# Patient Record
Sex: Female | Born: 1985 | Race: White | Hispanic: No | Marital: Married | Smoking: Former smoker
Health system: Southern US, Community
[De-identification: ages and names within clinical notes are randomized; demographics above are authoritative.]

## PROBLEM LIST (undated history)

## (undated) DIAGNOSIS — F419 Anxiety disorder, unspecified: Secondary | ICD-10-CM

## (undated) DIAGNOSIS — K219 Gastro-esophageal reflux disease without esophagitis: Secondary | ICD-10-CM

## (undated) DIAGNOSIS — I429 Cardiomyopathy, unspecified: Secondary | ICD-10-CM

## (undated) DIAGNOSIS — S2220XA Unspecified fracture of sternum, initial encounter for closed fracture: Secondary | ICD-10-CM

## (undated) DIAGNOSIS — G43909 Migraine, unspecified, not intractable, without status migrainosus: Secondary | ICD-10-CM

## (undated) DIAGNOSIS — J45909 Unspecified asthma, uncomplicated: Secondary | ICD-10-CM

## (undated) DIAGNOSIS — F319 Bipolar disorder, unspecified: Secondary | ICD-10-CM

## (undated) DIAGNOSIS — S060X9A Concussion with loss of consciousness of unspecified duration, initial encounter: Secondary | ICD-10-CM

## (undated) DIAGNOSIS — I722 Aneurysm of renal artery: Secondary | ICD-10-CM

## (undated) DIAGNOSIS — S060XAA Concussion with loss of consciousness status unknown, initial encounter: Secondary | ICD-10-CM

## (undated) HISTORY — PX: NO PAST SURGERIES: SHX2092

## (undated) HISTORY — PX: ESOPHAGOGASTRODUODENOSCOPY ENDOSCOPY: SHX5814

---

## 2015-03-13 ENCOUNTER — Emergency Department
Admission: EM | Admit: 2015-03-13 | Discharge: 2015-03-13 | Disposition: A | Discharge status: Home / Self Care | Payer: Self-pay | Attending: Emergency Medicine | Admitting: Emergency Medicine

## 2015-03-13 ENCOUNTER — Encounter: Payer: Self-pay | Admitting: *Deleted

## 2015-03-13 ENCOUNTER — Emergency Department: Payer: Self-pay

## 2015-03-13 DIAGNOSIS — G44319 Acute post-traumatic headache, not intractable: Secondary | ICD-10-CM | POA: Insufficient documentation

## 2015-03-13 DIAGNOSIS — Z87891 Personal history of nicotine dependence: Secondary | ICD-10-CM | POA: Insufficient documentation

## 2015-03-13 DIAGNOSIS — Z041 Encounter for examination and observation following transport accident: Secondary | ICD-10-CM

## 2015-03-13 HISTORY — DX: Bipolar disorder, unspecified: F31.9

## 2015-03-13 HISTORY — DX: Anxiety disorder, unspecified: F41.9

## 2015-03-13 MED ORDER — METOCLOPRAMIDE HCL 10 MG PO TABS
10.0000 mg | ORAL_TABLET | Freq: Once | ORAL | Status: AC
  Administered 2015-03-13: 10 mg via ORAL
  Filled 2015-03-13: qty 1

## 2015-03-13 MED ORDER — HYDROCODONE-ACETAMINOPHEN 5-325 MG PO TABS: 1.0000 | ORAL_TABLET | Freq: Three times a day (TID) | ORAL | Status: DC | PRN

## 2015-03-13 MED ORDER — ORPHENADRINE CITRATE 30 MG/ML IJ SOLN
60.0000 mg | INTRAMUSCULAR | Status: AC
  Administered 2015-03-13: 60 mg via INTRAMUSCULAR
  Filled 2015-03-13: qty 2

## 2015-03-13 MED ORDER — KETOROLAC TROMETHAMINE 10 MG PO TABS: 10.0000 mg | ORAL_TABLET | Freq: Three times a day (TID) | ORAL | Status: DC

## 2015-03-13 MED ORDER — DIPHENHYDRAMINE HCL 25 MG PO CAPS
25.0000 mg | ORAL_CAPSULE | Freq: Once | ORAL | Status: AC
  Administered 2015-03-13: 25 mg via ORAL
  Filled 2015-03-13: qty 1

## 2015-03-13 MED ORDER — KETOROLAC TROMETHAMINE 60 MG/2ML IM SOLN
60.0000 mg | Freq: Once | INTRAMUSCULAR | Status: AC
  Administered 2015-03-13: 60 mg via INTRAMUSCULAR
  Filled 2015-03-13: qty 2

## 2015-03-13 MED ORDER — CYCLOBENZAPRINE HCL 5 MG PO TABS: 5.0000 mg | ORAL_TABLET | Freq: Three times a day (TID) | ORAL | Status: DC | PRN
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
## 2015-03-13 NOTE — Discharge Instructions (Signed)
General Headache Without Cause A headache is pain or discomfort felt around the head or neck area. The specific cause of a headache may not be found. There are many causes and types of headaches. A few common ones are:  Tension headaches.  Migraine headaches.  Cluster headaches.  Chronic daily headaches. HOME CARE INSTRUCTIONS   Keep all follow-up appointments with your caregiver or any specialist referral.  Only take over-the-counter or prescription medicines for pain or discomfort as directed by your caregiver.  Lie down in a dark, quiet room when you have a headache.  Keep a headache journal to find out what may trigger your migraine headaches. For example, write down:  What you eat and drink.  How much sleep you get.  Any change to your diet or medicines.  Try massage or other relaxation techniques.  Put ice packs or heat on the head and neck. Use these 3 to 4 times per day for 15 to 20 minutes each time, or as needed.  Limit stress.  Sit up straight, and do not tense your muscles.  Quit smoking if you smoke.  Limit alcohol use.  Decrease the amount of caffeine you drink, or stop drinking caffeine.  Eat and sleep on a regular schedule.  Get 7 to 9 hours of sleep, or as recommended by your caregiver.  Keep lights dim if bright lights bother you and make your headaches worse. SEEK MEDICAL CARE IF:   You have problems with the medicines you were prescribed.  Your medicines are not working.  You have a change from the usual headache.  You have nausea or vomiting. SEEK IMMEDIATE MEDICAL CARE IF:   Your headache becomes severe.  You have a fever.  You have a stiff neck.  You have loss of vision.  You have muscular weakness or loss of muscle control.  You start losing your balance or have trouble walking.  You feel faint or pass out.  You have severe symptoms that are different from your first symptoms. MAKE SURE YOU:   Understand these  instructions.  Will watch your condition.  Will get help right away if you are not doing well or get worse. Document Released: 06/29/2005 Document Revised: 09/21/2011 Document Reviewed: 07/15/2011 [facility] Patient Information 2015 [place], [place]. This information is not intended to replace advice given to you by your health care provider. Make sure you discuss any questions you have with your health care provider.   Your exam and head CT were normal today. Take the prescription meds as directed.  Apply ice to any sore muscles. Follow-up with your provider for ongoing symptoms.
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
## 2015-05-31 ENCOUNTER — Emergency Department
Admission: EM | Admit: 2015-05-31 | Discharge: 2015-05-31 | Disposition: A | Discharge status: Home / Self Care | Payer: No Typology Code available for payment source | Attending: Emergency Medicine | Admitting: Emergency Medicine

## 2015-05-31 ENCOUNTER — Encounter: Payer: Self-pay | Admitting: Emergency Medicine

## 2015-05-31 DIAGNOSIS — R51 Headache: Secondary | ICD-10-CM | POA: Diagnosis present

## 2015-05-31 DIAGNOSIS — Z87891 Personal history of nicotine dependence: Secondary | ICD-10-CM | POA: Diagnosis not present

## 2015-05-31 DIAGNOSIS — G43809 Other migraine, not intractable, without status migrainosus: Secondary | ICD-10-CM | POA: Diagnosis not present

## 2015-05-31 HISTORY — DX: Concussion with loss of consciousness of unspecified duration, initial encounter: S06.0X9A

## 2015-05-31 HISTORY — DX: Migraine, unspecified, not intractable, without status migrainosus: G43.909

## 2015-05-31 HISTORY — DX: Concussion with loss of consciousness status unknown, initial encounter: S06.0XAA

## 2015-05-31 HISTORY — DX: Unspecified fracture of sternum, initial encounter for closed fracture: S22.20XA

## 2015-05-31 MED ORDER — DEXAMETHASONE SODIUM PHOSPHATE 10 MG/ML IJ SOLN
INTRAMUSCULAR | Status: AC
  Administered 2015-05-31: 10 mg via INTRAVENOUS
  Filled 2015-05-31: qty 1

## 2015-05-31 MED ORDER — SODIUM CHLORIDE 0.9 % IV SOLN
Freq: Once | INTRAVENOUS | Status: AC
  Administered 2015-05-31: 20:00:00 via INTRAVENOUS

## 2015-05-31 MED ORDER — METOCLOPRAMIDE HCL 5 MG/ML IJ SOLN
10.0000 mg | Freq: Once | INTRAMUSCULAR | Status: AC
  Administered 2015-05-31: 10 mg via INTRAVENOUS
  Filled 2015-05-31 (×2): qty 2

## 2015-05-31 MED ORDER — DEXAMETHASONE SODIUM PHOSPHATE 10 MG/ML IJ SOLN
10.0000 mg | Freq: Once | INTRAMUSCULAR | Status: AC
  Administered 2015-05-31: 10 mg via INTRAVENOUS
  Filled 2015-05-31 (×2): qty 1

## 2015-05-31 MED ORDER — PROMETHAZINE HCL 25 MG PO TABS: 25.0000 mg | ORAL_TABLET | Freq: Four times a day (QID) | ORAL | Status: DC | PRN

## 2015-05-31 MED ORDER — KETOROLAC TROMETHAMINE 30 MG/ML IJ SOLN
30.0000 mg | Freq: Once | INTRAMUSCULAR | Status: AC
  Administered 2015-05-31: 30 mg via INTRAVENOUS
  Filled 2015-05-31: qty 1

## 2015-05-31 NOTE — ED Provider Notes (Signed)
[facility] Emergency Department Provider Note     Time seen: ----------------------------------------- 8:26 PM on 05/31/2015 -----------------------------------------    I have reviewed the triage vital signs and the nursing notes.   HISTORY  Chief Complaint Headache    HPI Kayla Garrison is a 29 y.o. female who presents to ER for headache. Patient states his history of migraines and she has expressed a headache with nausea last night. Patient states she can't hold anything down, states the headache is left-sided behind her eye. She took Imitrex and Topamax without any relief. She states photophobia and sensitivity to sound, states pain is different from her normal migraine.   Past Medical History  Diagnosis Date  . Bipolar 1 disorder (HCC)   . Anxiety   . Migraines   . Concussion   . Sternal fracture     There are no active problems to display for this patient.   No past surgical history on file.  Allergies Eggs or egg-derived products  Social History Social History  Substance Use Topics  . Smoking status: Former Games developer  . Smokeless tobacco: Never Used  . Alcohol Use: No    Review of Systems Constitutional: Negative for fever. Eyes: Negative for visual changes. ENT: Negative for sore throat. Cardiovascular: Negative for chest pain. Respiratory: Negative for shortness of breath. Gastrointestinal: Negative for abdominal pain, positive for nausea and vomiting Genitourinary: Negative for dysuria. Musculoskeletal: Negative for back pain. Skin: Negative for rash. Neurological: Positive for headache, focal weakness or numbness.  10-point ROS otherwise negative.  ____________________________________________   PHYSICAL EXAM:  VITAL SIGNS: ED Triage Vitals  Enc Vitals Group     BP 05/31/15 1923 122/68 mmHg     Pulse Rate 05/31/15 1923 90     Resp 05/31/15 1923 14     Temp 05/31/15 1923 97.9 F (36.6 C)     Temp Source  05/31/15 1923 Oral     SpO2 05/31/15 1923 98 %     Weight 05/31/15 1923 190 lb (86.183 kg)     Height 05/31/15 1923  (1.702 m)     Head Cir --      Peak Flow --      Pain Score 05/31/15 1924 9     Pain Loc --      Pain Edu? --      Excl. in GC? --     Constitutional: Alert and oriented. Well appearing and in no distress. Eyes: Conjunctivae are normal. PERRL. Normal extraocular movements. ENT   Head: Normocephalic and atraumatic.   Nose: No congestion/rhinnorhea.   Mouth/Throat: Mucous membranes are moist.   Neck: No stridor. Cardiovascular: Normal rate, regular rhythm. Normal and symmetric distal pulses are present in all extremities. No murmurs, rubs, or gallops. Respiratory: Normal respiratory effort without tachypnea nor retractions. Breath sounds are clear and equal bilaterally. No wheezes/rales/rhonchi. Gastrointestinal: Soft and nontender. No distention. No abdominal bruits.  Musculoskeletal: Nontender with normal range of motion in all extremities. No joint effusions.  No lower extremity tenderness nor edema. Neurologic:  Normal speech and language. No gross focal neurologic deficits are appreciated. Speech is normal. No gait instability. Skin:  Skin is warm, dry and intact. No rash noted. Psychiatric: Mood and affect are normal. Speech and behavior are normal. Patient exhibits appropriate insight and judgment. ___________________________________________  ED COURSE:  Pertinent labs & imaging results that were available during my care of the patient were reviewed by me and considered in my medical decision making (see chart for  details). Patient is in no acute distress, will give typical headache cocktail and reevaluate.  ____________________________________________  FINAL ASSESSMENT AND PLAN  Migraine  Plan: Patient with labs and imaging as dictated above. Patient looks well, feels better after IV fluids and headache medications. Patient remains  neurologically intact. She'll be discharged with close outpatient follow-up.   Emily FilbertWilliams, Beckham Buxbaum E, MD   Emily FilbertJonathan E Hollie Wojahn, MD 05/31/15 2033

## 2015-05-31 NOTE — ED Notes (Signed)
Pt states history of migraines. Pt states began to experience headache with nausea last pm. Pt states "i can't hold anything down." pt states headache is left sided "behind my eyeball". Pt states took imitrex and topamax without relief. Pt states photophobia and sensitivity to sound. perrl 3mm and brisk. No nuchal rigidity, pt denies fever. Pt states pain is "different" from previous migraines.

## 2015-05-31 NOTE — Discharge Instructions (Signed)

## 2015-06-11 ENCOUNTER — Other Ambulatory Visit: Payer: Self-pay

## 2015-06-11 ENCOUNTER — Emergency Department: Payer: No Typology Code available for payment source

## 2015-06-11 DIAGNOSIS — Z87891 Personal history of nicotine dependence: Secondary | ICD-10-CM | POA: Diagnosis not present

## 2015-06-11 DIAGNOSIS — R51 Headache: Secondary | ICD-10-CM | POA: Diagnosis present

## 2015-06-11 DIAGNOSIS — G43809 Other migraine, not intractable, without status migrainosus: Secondary | ICD-10-CM | POA: Diagnosis not present

## 2015-06-11 NOTE — ED Notes (Signed)
Pt in with co headache for few days, also co chest pain and left arm pain that started today.

## 2015-06-12 ENCOUNTER — Emergency Department
Admission: EM | Admit: 2015-06-12 | Discharge: 2015-06-12 | Disposition: A | Discharge status: Home / Self Care | Payer: No Typology Code available for payment source | Attending: Emergency Medicine | Admitting: Emergency Medicine

## 2015-06-12 DIAGNOSIS — G43809 Other migraine, not intractable, without status migrainosus: Secondary | ICD-10-CM

## 2015-06-12 LAB — BASIC METABOLIC PANEL
ANION GAP: 8 (ref 5–15)
BUN: 11 mg/dL (ref 6–20)
CALCIUM: 9 mg/dL (ref 8.9–10.3)
CO2: 24 mmol/L (ref 22–32)
Chloride: 109 mmol/L (ref 101–111)
Creatinine, Ser: 0.59 mg/dL (ref 0.44–1.00)
GLUCOSE: 144 mg/dL — AB (ref 65–99)
POTASSIUM: 3.4 mmol/L — AB (ref 3.5–5.1)
Sodium: 141 mmol/L (ref 135–145)

## 2015-06-12 LAB — CBC
HEMATOCRIT: 39 % (ref 35.0–47.0)
HEMOGLOBIN: 13.1 g/dL (ref 12.0–16.0)
MCH: 30.6 pg (ref 26.0–34.0)
MCHC: 33.7 g/dL (ref 32.0–36.0)
MCV: 90.8 fL (ref 80.0–100.0)
Platelets: 227 10*3/uL (ref 150–440)
RBC: 4.3 MIL/uL (ref 3.80–5.20)
RDW: 13.1 % (ref 11.5–14.5)
WBC: 9.5 10*3/uL (ref 3.6–11.0)

## 2015-06-12 LAB — TROPONIN I

## 2015-06-12 MED ORDER — ONDANSETRON HCL 4 MG/2ML IJ SOLN
4.0000 mg | Freq: Once | INTRAMUSCULAR | Status: AC
  Administered 2015-06-12: 4 mg via INTRAVENOUS
  Filled 2015-06-12: qty 2

## 2015-06-12 MED ORDER — KETOROLAC TROMETHAMINE 10 MG PO TABS
10.0000 mg | ORAL_TABLET | Freq: Once | ORAL | Status: AC
  Administered 2015-06-12: 10 mg via ORAL
  Filled 2015-06-12: qty 1

## 2015-06-12 MED ORDER — KETOROLAC TROMETHAMINE 30 MG/ML IJ SOLN
30.0000 mg | Freq: Once | INTRAMUSCULAR | Status: AC
  Administered 2015-06-12: 30 mg via INTRAVENOUS
  Filled 2015-06-12: qty 1

## 2015-06-12 MED ORDER — KETOROLAC TROMETHAMINE 10 MG PO TABS: 10.0000 mg | ORAL_TABLET | Freq: Three times a day (TID) | ORAL | Status: DC | PRN

## 2015-06-12 NOTE — Discharge Instructions (Signed)

## 2015-06-12 NOTE — ED Notes (Signed)
Pt ambulatory with steady gait upon D/C. Pt is waiting in lobby for ride, states ride will be here in 30 minutes.

## 2015-06-12 NOTE — ED Provider Notes (Signed)
[facility] Emergency Department Provider Note  ____________________________________________  Time seen: 2:30 AM  I have reviewed the triage vital signs and the nursing notes.   HISTORY  Chief Complaint Headache     HPI Kayla Garrison is a 29 y.o. female presents with complaint of left sided headache times "a few days". Patient states current pain score 7 out of 10. Patient states that the pain is located behind her left eye. Admits to history of migraines however states this migraine is "different". Of note patient was seen on 05/31/2015 with similar complaint CT scan was not performed at that time.     Past Medical History  Diagnosis Date  . Bipolar 1 disorder (HCC)   . Anxiety   . Migraines   . Concussion   . Sternal fracture     There are no active problems to display for this patient.   No past surgical history on file.  Current Outpatient Rx  Name  Route  Sig  Dispense  Refill  . cyclobenzaprine (FLEXERIL) 5 MG tablet   Oral   Take 1 tablet (5 mg total) by mouth every 8 (eight) hours as needed for muscle spasms.   12 tablet   0   . HYDROcodone-acetaminophen (NORCO) 5-325 MG per tablet   Oral   Take 1 tablet by mouth every 8 (eight) hours as needed for moderate pain.   10 tablet   0   . ketorolac (TORADOL) 10 MG tablet   Oral   Take 1 tablet (10 mg total) by mouth every 8 (eight) hours.   15 tablet   0   . promethazine (PHENERGAN) 25 MG tablet   Oral   Take 1 tablet (25 mg total) by mouth every 6 (six) hours as needed for nausea or vomiting.   20 tablet   0     Allergies Eggs or egg-derived products  No family history on file.  Social History Social History  Substance Use Topics  . Smoking status: Former Games developermoker  . Smokeless tobacco: Never Used  . Alcohol Use: No    Review of Systems  Constitutional: Negative for fever. Eyes: Negative for visual changes. ENT: Negative for sore throat. Cardiovascular:  Negative for chest pain. Respiratory: Negative for shortness of breath. Gastrointestinal: Negative for abdominal pain, vomiting and diarrhea. Genitourinary: Negative for dysuria. Musculoskeletal: Negative for back pain. Skin: Negative for rash. Neurological: Positive for headaches, negative for focal weakness or numbness.   10-point ROS otherwise negative.  ____________________________________________   PHYSICAL EXAM:  VITAL SIGNS: ED Triage Vitals  Enc Vitals Group     BP 06/11/15 2316 122/75 mmHg     Pulse Rate 06/11/15 2316 80     Resp 06/11/15 2316 18     Temp 06/11/15 2316 98.1 F (36.7 C)     Temp Source 06/11/15 2316 Oral     SpO2 06/11/15 2316 98 %     Weight 06/11/15 2316 170 lb (77.111 kg)     Height 06/11/15 2316 5\' 7"  (1.702 m)     Head Cir --      Peak Flow --      Pain Score 06/11/15 2318 6     Pain Loc --      Pain Edu? --      Excl. in GC? --      Constitutional: Alert and oriented. Well appearing and in no distress. Eyes: Conjunctivae are normal. PERRL. Normal extraocular movements. ENT   Head: Normocephalic and atraumatic.  Nose: No congestion/rhinnorhea.   Mouth/Throat: Mucous membranes are moist.   Neck: No stridor. Hematological/Lymphatic/Immunilogical: No cervical lymphadenopathy. Cardiovascular: Normal rate, regular rhythm. Normal and symmetric distal pulses are present in all extremities. No murmurs, rubs, or gallops. Respiratory: Normal respiratory effort without tachypnea nor retractions. Breath sounds are clear and equal bilaterally. No wheezes/rales/rhonchi. Gastrointestinal: Soft and nontender. No distention. There is no CVA tenderness. Genitourinary: deferred Musculoskeletal: Nontender with normal range of motion in all extremities. No joint effusions.  No lower extremity tenderness nor edema. Neurologic:  Normal speech and language. No gross focal neurologic deficits are appreciated. Speech is normal.  Skin:  Skin is warm,  dry and intact. No rash noted. Psychiatric: Mood and affect are normal. Speech and behavior are normal. Patient exhibits appropriate insight and judgment.  ____________________________________________    LABS (pertinent positives/negatives)  Labs Reviewed  BASIC METABOLIC PANEL - Abnormal; Notable for the following:    Potassium 3.4 (*)    Glucose, Bld 144 (*)    All other components within normal limits  CBC  TROPONIN I      RADIOLOGY       CT Head Wo Contrast (Final result) Result time: 06/12/15 00:04:51   Final result by Rad Results In Interface (06/12/15 00:04:51)   Narrative:   CLINICAL DATA: Headache for several days.  EXAM: CT HEAD WITHOUT CONTRAST  TECHNIQUE: Contiguous axial images were obtained from the base of the skull through the vertex without intravenous contrast.  COMPARISON: 03/13/2015  FINDINGS: There is no intracranial hemorrhage, mass or evidence of acute infarction. There is no extra-axial fluid collection. Gray matter and white matter appear normal. Cerebral volume is normal for age. Brainstem and posterior fossa are unremarkable. The CSF spaces appear normal.  The bony structures are intact. The visible portions of the paranasal sinuses are clear.  IMPRESSION: Normal brain   Electronically Signed By: Ellery Plunk M.D. On: 06/12/2015 00:04            INITIAL IMPRESSION / ASSESSMENT AND PLAN / ED COURSE  Pertinent labs & imaging results that were available during my care of the patient were reviewed by me and considered in my medical decision making (see chart for details).  Toradol 30 mg IV and Zofran 4 mg IV given with resolution of pain  ____________________________________________   FINAL CLINICAL IMPRESSION(S) / ED DIAGNOSES  Final diagnoses:  Other migraine without status migrainosus, not intractable      Darci Current, MD 06/12/15 [phone]

## 2015-06-12 NOTE — ED Notes (Signed)
Pt presents to ED with c/o headache x 2 weeks, pt reports was seen here 2 weeks for same symptoms. Pt c/o left sided headache, left sided weakness, and left sided chest pain. When asked to point where pt is hurting pt points to left side of ribs. (+) photosensitivity and sensitive to sound. Pt denies abdominal pain, shortness of breath, or fevers. Pt alert and oriented x 4, no increased work in breathing noted, skin warm and dry. Call bell within reach.

## 2015-08-13 ENCOUNTER — Encounter: Payer: Self-pay | Admitting: Emergency Medicine

## 2015-08-13 ENCOUNTER — Emergency Department: Payer: BLUE CROSS/BLUE SHIELD

## 2015-08-13 ENCOUNTER — Emergency Department
Admission: EM | Admit: 2015-08-13 | Discharge: 2015-08-13 | Disposition: A | Discharge status: Home / Self Care | Payer: BLUE CROSS/BLUE SHIELD | Attending: Student | Admitting: Student

## 2015-08-13 DIAGNOSIS — F419 Anxiety disorder, unspecified: Secondary | ICD-10-CM | POA: Insufficient documentation

## 2015-08-13 DIAGNOSIS — Z87891 Personal history of nicotine dependence: Secondary | ICD-10-CM | POA: Insufficient documentation

## 2015-08-13 DIAGNOSIS — R238 Other skin changes: Secondary | ICD-10-CM

## 2015-08-13 DIAGNOSIS — R208 Other disturbances of skin sensation: Secondary | ICD-10-CM

## 2015-08-13 DIAGNOSIS — Z791 Long term (current) use of non-steroidal anti-inflammatories (NSAID): Secondary | ICD-10-CM | POA: Insufficient documentation

## 2015-08-13 DIAGNOSIS — L539 Erythematous condition, unspecified: Secondary | ICD-10-CM

## 2015-08-13 DIAGNOSIS — M25562 Pain in left knee: Secondary | ICD-10-CM | POA: Diagnosis present

## 2015-08-13 DIAGNOSIS — F319 Bipolar disorder, unspecified: Secondary | ICD-10-CM | POA: Insufficient documentation

## 2015-08-13 MED ORDER — TRAMADOL HCL 50 MG PO TABS
50.0000 mg | ORAL_TABLET | Freq: Once | ORAL | Status: AC
  Administered 2015-08-13: 50 mg via ORAL
  Filled 2015-08-13: qty 1

## 2015-08-13 MED ORDER — NAPROXEN 500 MG PO TABS: 500.0000 mg | ORAL_TABLET | Freq: Two times a day (BID) | ORAL | Status: DC

## 2015-08-13 MED ORDER — NAPROXEN 500 MG PO TABS
500.0000 mg | ORAL_TABLET | Freq: Once | ORAL | Status: AC
  Administered 2015-08-13: 500 mg via ORAL
  Filled 2015-08-13: qty 1

## 2015-08-13 MED ORDER — TRAMADOL HCL 50 MG PO TABS: 50.0000 mg | ORAL_TABLET | Freq: Four times a day (QID) | ORAL | Status: DC | PRN
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
## 2015-08-13 NOTE — ED Provider Notes (Signed)
[facility] Emergency Department Provider Note  ____________________________________________  Time seen: Approximately 1:00 PM  I have reviewed the triage vital signs and the nursing notes.   HISTORY  Chief Complaint Leg Pain and Leg Swelling    HPI Kayla Garrison is a 30 y.o. female patient complaining of pain and swelling popliteal area of the left leg. She also states since The pain. Patient says no provocative incident. Patient stated pain increases with ambulation and weightbearing.Patient rating the pain as 8/10. No palliative this complaint.   Past Medical History  Diagnosis Date  . Bipolar 1 disorder (HCC)   . Anxiety   . Migraines   . Concussion   . Sternal fracture     There are no active problems to display for this patient.   No past surgical history on file.  Current Outpatient Rx  Name  Route  Sig  Dispense  Refill  . cyclobenzaprine (FLEXERIL) 5 MG tablet   Oral   Take 1 tablet (5 mg total) by mouth every 8 (eight) hours as needed for muscle spasms.   12 tablet   0   . HYDROcodone-acetaminophen (NORCO) 5-325 MG per tablet   Oral   Take 1 tablet by mouth every 8 (eight) hours as needed for moderate pain.   10 tablet   0   . ketorolac (TORADOL) 10 MG tablet   Oral   Take 1 tablet (10 mg total) by mouth every 8 (eight) hours.   15 tablet   0   . ketorolac (TORADOL) 10 MG tablet   Oral   Take 1 tablet (10 mg total) by mouth every 8 (eight) hours as needed.   20 tablet   0   . naproxen (NAPROSYN) 500 MG tablet   Oral   Take 1 tablet (500 mg total) by mouth 2 (two) times daily with a meal.   20 tablet   0   . promethazine (PHENERGAN) 25 MG tablet   Oral   Take 1 tablet (25 mg total) by mouth every 6 (six) hours as needed for nausea or vomiting.   20 tablet   0   . traMADol (ULTRAM) 50 MG tablet   Oral   Take 1 tablet (50 mg total) by mouth every 6 (six) hours as needed for moderate pain.   12 tablet   0      Allergies Eggs or egg-derived products  No family history on file.  Social History Social History  Substance Use Topics  . Smoking status: Former Games developer  . Smokeless tobacco: Never Used  . Alcohol Use: No    Review of Systems Constitutional: No fever/chills Eyes: No visual changes. ENT: No sore throat. Cardiovascular: Denies chest pain. Respiratory: Denies shortness of breath. Gastrointestinal: No abdominal pain.  No nausea, no vomiting.  No diarrhea.  No constipation. Genitourinary: Negative for dysuria. Musculoskeletal: Left leg pain popliteal area and left calf.. Skin: Negative for rash. Neurological: Negative for headaches, focal weakness or numbness. Psychiatric:Bipolar and anxiety. 10-point ROS otherwise negative.  ____________________________________________   PHYSICAL EXAM:  VITAL SIGNS: ED Triage Vitals  Enc Vitals Group     BP 08/13/15 1009 109/69 mmHg     Pulse Rate 08/13/15 1009 78     Resp 08/13/15 1009 14     Temp 08/13/15 1009 97.8 F (36.6 C)     Temp Source 08/13/15 1009 Oral     SpO2 08/13/15 1009 100 %     Weight 08/13/15 1009 170 lb (77.111  kg)     Height 08/13/15 1009  (1.702 m)     Head Cir --      Peak Flow --      Pain Score --      Pain Loc --      Pain Edu? --      Excl. in GC? --     Constitutional: Alert and oriented. Well appearing and in no acute distress. Eyes: Conjunctivae are normal. PERRL. EOMI. Head: Atraumatic. Nose: No congestion/rhinnorhea. Mouth/Throat: Mucous membranes are moist.  Oropharynx non-erythematous. Neck: No stridor.  No cervical spine tenderness to palpation. Hematological/Lymphatic/Immunilogical: No cervical lymphadenopathy. Cardiovascular: Normal rate, regular rhythm. Grossly normal heart sounds.  Good peripheral circulation. Respiratory: Normal respiratory effort.  No retractions. Lungs CTAB. Gastrointestinal: Soft and nontender. No distention. No abdominal bruits. No CVA  tenderness. Musculoskeletal: No obvious deformity or edema erythema to the left lower extremity. Patient has some moderate guarding palpation as a 7 popliteal area. Patient also has some posterior calf guarding with palpation. Negative Homans sign.  Neurologic:  Normal speech and language. No gross focal neurologic deficits are appreciated. No gait instability. Skin:  Skin is warm, dry and intact. No rash noted. Psychiatric: Mood and affect are normal. Speech and behavior are normal.  ____________________________________________   LABS (all labs ordered are listed, but only abnormal results are displayed)  Labs Reviewed - No data to display ____________________________________________  EKG   ____________________________________________  RADIOLOGY  Ultrasound was negative for DVT. X-ray of the knee negative for acute findings. ____________________________________________   PROCEDURES  Procedure(s) performed: None  Critical Care performed: No  ____________________________________________   INITIAL IMPRESSION / ASSESSMENT AND PLAN / ED COURSE  Pertinent labs & imaging results that were available during my care of the patient were reviewed by me and considered in my medical decision making (see chart for details).  Left knee pain. Reexamination of the knee again shows no obvious edema erythema. Discussed x-ray and ultrasound cells with patient. Patient given a prescription for tramadol and naproxen. She advised return by ER for condition worsens.  FINAL CLINICAL IMPRESSION(S) / ED DIAGNOSES  Final diagnoses:  Posterior left knee pain      Joni Reining, PA-C 08/13/15 1407  Joni Reining, PA-C 08/13/15 1420  Gayla Doss, MD 08/13/15 1759

## 2015-08-23 ENCOUNTER — Other Ambulatory Visit: Payer: Self-pay | Admitting: Unknown Physician Specialty

## 2015-08-23 DIAGNOSIS — M25552 Pain in left hip: Secondary | ICD-10-CM

## 2015-08-24 ENCOUNTER — Emergency Department
Admission: EM | Admit: 2015-08-24 | Discharge: 2015-08-25 | Disposition: A | Discharge status: Home / Self Care | Payer: BLUE CROSS/BLUE SHIELD | Attending: Emergency Medicine | Admitting: Emergency Medicine

## 2015-08-24 ENCOUNTER — Emergency Department: Payer: BLUE CROSS/BLUE SHIELD

## 2015-08-24 ENCOUNTER — Encounter: Payer: Self-pay | Admitting: Emergency Medicine

## 2015-08-24 DIAGNOSIS — Z3202 Encounter for pregnancy test, result negative: Secondary | ICD-10-CM | POA: Diagnosis not present

## 2015-08-24 DIAGNOSIS — R109 Unspecified abdominal pain: Secondary | ICD-10-CM

## 2015-08-24 DIAGNOSIS — Z87891 Personal history of nicotine dependence: Secondary | ICD-10-CM | POA: Diagnosis not present

## 2015-08-24 DIAGNOSIS — Z791 Long term (current) use of non-steroidal anti-inflammatories (NSAID): Secondary | ICD-10-CM | POA: Insufficient documentation

## 2015-08-24 DIAGNOSIS — N760 Acute vaginitis: Secondary | ICD-10-CM | POA: Diagnosis not present

## 2015-08-24 DIAGNOSIS — R1030 Lower abdominal pain, unspecified: Secondary | ICD-10-CM | POA: Diagnosis present

## 2015-08-24 DIAGNOSIS — B9689 Other specified bacterial agents as the cause of diseases classified elsewhere: Secondary | ICD-10-CM

## 2015-08-24 HISTORY — DX: Unspecified asthma, uncomplicated: J45.909

## 2015-08-24 LAB — COMPREHENSIVE METABOLIC PANEL
ALK PHOS: 61 U/L (ref 38–126)
ALT: 9 U/L — AB (ref 14–54)
AST: 12 U/L — ABNORMAL LOW (ref 15–41)
Albumin: 4.3 g/dL (ref 3.5–5.0)
Anion gap: 8 (ref 5–15)
BILIRUBIN TOTAL: 1 mg/dL (ref 0.3–1.2)
BUN: 10 mg/dL (ref 6–20)
CALCIUM: 8.7 mg/dL — AB (ref 8.9–10.3)
CO2: 23 mmol/L (ref 22–32)
CREATININE: 0.67 mg/dL (ref 0.44–1.00)
Chloride: 106 mmol/L (ref 101–111)
Glucose, Bld: 101 mg/dL — ABNORMAL HIGH (ref 65–99)
Potassium: 3.7 mmol/L (ref 3.5–5.1)
Sodium: 137 mmol/L (ref 135–145)
Total Protein: 7 g/dL (ref 6.5–8.1)

## 2015-08-24 LAB — URINALYSIS COMPLETE WITH MICROSCOPIC (ARMC ONLY)
BACTERIA UA: NONE SEEN
BILIRUBIN URINE: NEGATIVE
GLUCOSE, UA: NEGATIVE mg/dL
NITRITE: NEGATIVE
Protein, ur: NEGATIVE mg/dL
RBC / HPF: NONE SEEN RBC/hpf (ref 0–5)
SPECIFIC GRAVITY, URINE: 1.024 (ref 1.005–1.030)
Squamous Epithelial / LPF: NONE SEEN
WBC UA: NONE SEEN WBC/hpf (ref 0–5)
pH: 7 (ref 5.0–8.0)

## 2015-08-24 LAB — WET PREP, GENITAL
SPERM: NONE SEEN
Trich, Wet Prep: NONE SEEN
Yeast Wet Prep HPF POC: NONE SEEN

## 2015-08-24 LAB — POCT PREGNANCY, URINE: Preg Test, Ur: NEGATIVE

## 2015-08-24 LAB — CBC
HEMATOCRIT: 40.6 % (ref 35.0–47.0)
HEMOGLOBIN: 13.6 g/dL (ref 12.0–16.0)
MCH: 30.6 pg (ref 26.0–34.0)
MCHC: 33.4 g/dL (ref 32.0–36.0)
MCV: 91.4 fL (ref 80.0–100.0)
Platelets: 222 10*3/uL (ref 150–440)
RBC: 4.44 MIL/uL (ref 3.80–5.20)
RDW: 13.1 % (ref 11.5–14.5)
WBC: 12.1 10*3/uL — ABNORMAL HIGH (ref 3.6–11.0)

## 2015-08-24 LAB — LIPASE, BLOOD: Lipase: 29 U/L (ref 11–51)

## 2015-08-24 MED ORDER — METRONIDAZOLE 500 MG PO TABS: 500.0000 mg | ORAL_TABLET | Freq: Two times a day (BID) | ORAL | Status: DC

## 2015-08-24 MED ORDER — MORPHINE SULFATE (PF) 4 MG/ML IV SOLN
INTRAVENOUS | Status: AC
  Filled 2015-08-24: qty 1

## 2015-08-24 MED ORDER — SODIUM CHLORIDE 0.9 % IV BOLUS (SEPSIS)
1000.0000 mL | Freq: Once | INTRAVENOUS | Status: AC
  Administered 2015-08-24: 1000 mL via INTRAVENOUS

## 2015-08-24 MED ORDER — IOHEXOL 240 MG/ML SOLN
25.0000 mL | Freq: Once | INTRAMUSCULAR | Status: AC | PRN
  Administered 2015-08-24: 25 mL via ORAL

## 2015-08-24 MED ORDER — IOHEXOL 300 MG/ML  SOLN
100.0000 mL | Freq: Once | INTRAMUSCULAR | Status: AC | PRN
  Administered 2015-08-24: 100 mL via INTRAVENOUS

## 2015-08-24 MED ORDER — ONDANSETRON HCL 4 MG/2ML IJ SOLN
4.0000 mg | Freq: Once | INTRAMUSCULAR | Status: AC
  Administered 2015-08-24: 4 mg via INTRAVENOUS
  Filled 2015-08-24: qty 2

## 2015-08-24 MED ORDER — MORPHINE SULFATE (PF) 4 MG/ML IV SOLN
4.0000 mg | Freq: Once | INTRAVENOUS | Status: AC
  Administered 2015-08-24: 4 mg via INTRAVENOUS

## 2015-08-24 MED ORDER — MORPHINE SULFATE (PF) 4 MG/ML IV SOLN
4.0000 mg | Freq: Once | INTRAVENOUS | Status: AC
  Administered 2015-08-24: 4 mg via INTRAVENOUS
  Filled 2015-08-24: qty 1

## 2015-08-24 NOTE — ED Notes (Signed)
Pt sleeping. 
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
## 2015-08-24 NOTE — ED Notes (Signed)
Dr. Shaune Pollack in with Kayla Garrison, ed tech to perform pelvic exam. Pt states nausea gone, pain improved.

## 2015-08-24 NOTE — ED Notes (Signed)
Lower L abdomen pain x several months, began vomiting today.

## 2015-08-25 LAB — CHLAMYDIA/NGC RT PCR (ARMC ONLY)
CHLAMYDIA TR: NOT DETECTED
N GONORRHOEAE: NOT DETECTED

## 2015-08-25 MED ORDER — DOXYCYCLINE HYCLATE 50 MG PO CAPS: 100.0000 mg | ORAL_CAPSULE | Freq: Two times a day (BID) | ORAL | Status: DC

## 2015-08-25 MED ORDER — DEXTROSE 5 % IV SOLN
250.0000 mg | Freq: Once | INTRAVENOUS | Status: AC
  Administered 2015-08-25: 250 mg via INTRAVENOUS
  Filled 2015-08-25: qty 250

## 2015-08-25 MED ORDER — DOXYCYCLINE HYCLATE 100 MG PO TABS
100.0000 mg | ORAL_TABLET | Freq: Once | ORAL | Status: AC
  Administered 2015-08-25: 100 mg via ORAL
  Filled 2015-08-25 (×2): qty 1

## 2015-08-25 NOTE — ED Provider Notes (Addendum)
-----------------------------------------   1:19 AM on 08/25/2015 -----------------------------------------  Care Was assumed from Dr. Shaune Pollack  At 12 am pending results of CT abdomen and pelvis. CT scan shows nonobstructing 4 mm left nephrolithiasis but no other acute intra-abdominal or pelvic process. The patient appears well. DC with return precautions and close PCP and GYN follow-up. She is comfortable with the discharge plan. Dr. Shaune Pollack provided prescription for doxycycline for possible pelvic inflammatory disease and flagyl for BV.  Gayla Doss, MD 08/25/15 1610  Gayla Doss, MD 08/25/15 [phone]

## 2015-08-25 NOTE — ED Notes (Signed)
Pt returned from ct scan, reports improved nausea and pain. Denies needs at this time.

## 2015-08-25 NOTE — Discharge Instructions (Signed)
Return to the emergency department for any worsening abdominal pain, vomiting, black or bloody stools, fever, or any other symptoms concerning to you.   Bacterial Vaginosis Bacterial vaginosis is an infection of the vagina. It happens when too many germs (bacteria) grow in the vagina. Having this infection puts you at risk for getting other infections from sex. Treating this infection can help lower your risk for other infections, such as:   Chlamydia.  Gonorrhea.  HIV.  Herpes. HOME CARE  Take your medicine as told by your doctor.  Finish your medicine even if you start to feel better.  Tell your sex partner that you have an infection. They should see their doctor for treatment.  During treatment:  Avoid sex or use condoms correctly.  Do not douche.  Do not drink alcohol unless your doctor tells you it is ok.  Do not breastfeed unless your doctor tells you it is ok. GET HELP IF:  You are not getting better after 3 days of treatment.  You have more grey fluid (discharge) coming from your vagina than before.  You have more pain than before.  You have a fever. MAKE SURE YOU:   Understand these instructions.  Will watch your condition.  Will get help right away if you are not doing well or get worse.   This information is not intended to replace advice given to you by your health care provider. Make sure you discuss any questions you have with your health care provider.   Document Released: 04/07/2008 Document Revised: 07/20/2014 Document Reviewed: 02/08/2013 Elsevier Interactive Patient Education 2016 Else[employer] Inc[employer]  Abdominal Pain, Adult Many things can cause abdominal pain. Usually, abdominal pain is not caused by a disease and will improve without treatment. It can often be observed and treated at home. Your health care provider will do a physical exam and possibly order blood tests and X-rays to help determine the seriousness of your pain. However, in many  cases, more time must pass before a clear cause of the pain can be found. Before that point, your health care provider may not know if you need more testing or further treatment. HOME CARE INSTRUCTIONS Monitor your abdominal pain for any changes. The following actions may help to alleviate any discomfort you are experiencing:  Only take over-the-counter or prescription medicines as directed by your health care provider.  Do not take laxatives unless directed to do so by your health care provider.  Try a clear liquid diet (broth, tea, or water) as directed by your health care provider. Slowly move to a bland diet as tolerated. SEEK MEDICAL CARE IF:  You have unexplained abdominal pain.  You have abdominal pain associated with nausea or diarrhea.  You have pain when you urinate or have a bowel movement.  You experience abdominal pain that wakes you in the night.  You have abdominal pain that is worsened or improved by eating food.  You have abdominal pain that is worsened with eating fatty foods.  You have a fever. SEEK IMMEDIATE MEDICAL CARE IF:  Your pain does not go away within 2 hours.  You keep throwing up (vomiting).  Your pain is felt only in portions of the abdomen, such as the right side or the left lower portion of the abdomen.  You pass bloody or black tarry stools. MAKE SURE YOU:  Understand these instructions.  Will watch your condition.  Will get help right away if you are not doing well or get worse.  This information is not intended to replace advice given to you by your health care provider. Make sure you discuss any questions you have with your health care provider.   Document Released: 04/08/2005 Document Revised: 03/20/2015 Document Reviewed: 03/08/2013 Elsevier Interactive Patient Education 2016 Elsevier Inc[employer]Pelvic Inflammatory Disease Pelvic inflammatory disease (PID) refers to an infection in some or all of the female organs. The infection can be in  the uterus, ovaries, fallopian tubes, or the surrounding tissues in the pelvis. PID can cause abdominal or pelvic pain that comes on suddenly (acute pelvic pain). PID is a serious infection because it can lead to lasting (chronic) pelvic pain or the inability to have children (infertility). CAUSES This condition is most often caused by an infection that is spread during sexual contact. However, the infection can also be caused by the normal bacteria that are found in the vaginal tissues if these bacteria travel upward into the reproductive organs. PID can also occur following:  The birth of a baby.  A miscarriage.  An abortion.  Major pelvic surgery.  The use of an intrauterine device (IUD).  A sexual assault. RISK FACTORS This condition is more likely to develop in women who:  Are younger than 32 years of age.  Are sexually active at [facility] age.  Use nonbarrier contraception.  Have multiple sexual partners.  Have sex with someone who has symptoms of an STD (sexually transmitted disease).  Use oral contraception. At times, certain behaviors can also increase the possibility of getting PID, such as:  Using a vaginal douche.  Having an IUD in place. SYMPTOMS Symptoms of this condition include:  Abdominal or pelvic pain.  Fever.  Chills.  Abnormal vaginal discharge.  Abnormal uterine bleeding.  Unusual pain shortly after the end of a menstrual period.  Painful urination.  Pain with sexual intercourse.  Nausea and vomiting. DIAGNOSIS To diagnose this condition, your health care provider will do a physical exam and take your medical history. A pelvic exam typically reveals great tenderness in the uterus and the surrounding pelvic tissues. You may also have tests, such as:  Lab tests, including a pregnancy test, blood tests, and urine test.  Culture tests of the vagina and cervix to check for an STD.  Ultrasound.  A laparoscopic procedure to look inside the  pelvis.  Examining vaginal secretions under a microscope. TREATMENT Treatment for this condition may involve one or more approaches.  Antibiotic medicines may be prescribed to be taken by mouth.  Sexual partners may need to be treated if the infection is caused by an STD.  For more severe cases, hospitalization may be needed to give antibiotics directly into a vein through an IV tube.  Surgery may be needed if other treatments do not help, but this is rare. It may take weeks until you are completely well. If you are diagnosed with PID, you should also be checked for human immunodeficiency virus (HIV). Your health care provider may test you for infection again 3 months after treatment. You should not have unprotected sex. HOME CARE INSTRUCTIONS  Take over-the-counter and prescription medicines only as told by your health care provider.  If you were prescribed an antibiotic medicine, take it as told by your health care provider. Do not stop taking the antibiotic even if you start to feel better.  Do not have sexual intercourse until treatment is completed or as told by your health care provider. If PID is confirmed, your recent sexual partners will need treatment, especially if  you had unprotected sex.  Keep all follow-up visits as told by your health care provider. This is important. SEEK MEDICAL CARE IF:  You have increased or abnormal vaginal discharge.  Your pain does not improve.  You vomit.  You have a fever.  You cannot tolerate your medicines.  Your partner has an STD.  You have pain when you urinate. SEEK IMMEDIATE MEDICAL CARE IF:  You have increased abdominal or pelvic pain.  You have chills.  Your symptoms are not better in 72 hours even with treatment.   This information is not intended to replace advice given to you by your health care provider. Make sure you discuss any questions you have with your health care provider.   Document Released: 06/29/2005  Document Revised: 03/20/2015 Document Reviewed: 08/06/2014 Elsevier Interactive Patient Education [employer]

## 2015-08-25 NOTE — ED Notes (Signed)
Informed pt once IV antibiotic is infused she will be going home, pt reports she was informed after infusion she needed to wait 30 minutes, advised pt that statement is correct pt reports she told her husband to come and pick her up at 02:00am. Informed pt it will be around 02:30 after infusion.

## 2015-08-26 ENCOUNTER — Other Ambulatory Visit: Payer: Self-pay | Admitting: Unknown Physician Specialty

## 2015-08-26 DIAGNOSIS — M25552 Pain in left hip: Secondary | ICD-10-CM

## 2015-08-26 DIAGNOSIS — R1032 Left lower quadrant pain: Secondary | ICD-10-CM

## 2015-08-28 ENCOUNTER — Ambulatory Visit
Admission: RE | Admit: 2015-08-28 | Discharge: 2015-08-28 | Disposition: A | Discharge status: Home / Self Care | Payer: BLUE CROSS/BLUE SHIELD | Source: Ambulatory Visit | Attending: Unknown Physician Specialty | Admitting: Unknown Physician Specialty

## 2015-09-02 ENCOUNTER — Emergency Department
Admission: EM | Admit: 2015-09-02 | Discharge: 2015-09-02 | Disposition: A | Discharge status: Home / Self Care | Payer: BLUE CROSS/BLUE SHIELD | Attending: Emergency Medicine | Admitting: Emergency Medicine

## 2015-09-02 ENCOUNTER — Encounter: Payer: Self-pay | Admitting: Emergency Medicine

## 2015-09-02 ENCOUNTER — Emergency Department: Payer: BLUE CROSS/BLUE SHIELD

## 2015-09-02 DIAGNOSIS — Z79899 Other long term (current) drug therapy: Secondary | ICD-10-CM | POA: Insufficient documentation

## 2015-09-02 DIAGNOSIS — Z87891 Personal history of nicotine dependence: Secondary | ICD-10-CM | POA: Insufficient documentation

## 2015-09-02 DIAGNOSIS — R102 Pelvic and perineal pain: Secondary | ICD-10-CM | POA: Diagnosis not present

## 2015-09-02 DIAGNOSIS — R1032 Left lower quadrant pain: Secondary | ICD-10-CM

## 2015-09-02 DIAGNOSIS — Z791 Long term (current) use of non-steroidal anti-inflammatories (NSAID): Secondary | ICD-10-CM | POA: Insufficient documentation

## 2015-09-02 DIAGNOSIS — Z3202 Encounter for pregnancy test, result negative: Secondary | ICD-10-CM | POA: Insufficient documentation

## 2015-09-02 DIAGNOSIS — Z792 Long term (current) use of antibiotics: Secondary | ICD-10-CM | POA: Insufficient documentation

## 2015-09-02 LAB — POCT PREGNANCY, URINE: PREG TEST UR: NEGATIVE

## 2015-09-02 LAB — COMPREHENSIVE METABOLIC PANEL
ALBUMIN: 4.3 g/dL (ref 3.5–5.0)
ALT: 10 U/L — ABNORMAL LOW (ref 14–54)
ANION GAP: 6 (ref 5–15)
AST: 13 U/L — AB (ref 15–41)
Alkaline Phosphatase: 49 U/L (ref 38–126)
BUN: 13 mg/dL (ref 6–20)
CHLORIDE: 105 mmol/L (ref 101–111)
CO2: 30 mmol/L (ref 22–32)
Calcium: 9.2 mg/dL (ref 8.9–10.3)
Creatinine, Ser: 0.65 mg/dL (ref 0.44–1.00)
GFR calc Af Amer: >60 mL/min (ref >60)
GFR calc non Af Amer: >60 mL/min (ref >60)
GLUCOSE: 90 mg/dL (ref 65–99)
POTASSIUM: 3.7 mmol/L (ref 3.5–5.1)
SODIUM: 141 mmol/L (ref 135–145)
Total Bilirubin: 0.6 mg/dL (ref 0.3–1.2)
Total Protein: 6.8 g/dL (ref 6.5–8.1)

## 2015-09-02 LAB — CBC WITH DIFFERENTIAL/PLATELET
BASOS ABS: 0 10*3/uL (ref 0–0.1)
BASOS PCT: 1 %
Eosinophils Absolute: 0.5 10*3/uL (ref 0–0.7)
Eosinophils Relative: 5 %
HEMATOCRIT: 39.7 % (ref 35.0–47.0)
HEMOGLOBIN: 13.5 g/dL (ref 12.0–16.0)
LYMPHS PCT: 28 %
Lymphs Abs: 2.6 10*3/uL (ref 1.0–3.6)
MCH: 31 pg (ref 26.0–34.0)
MCHC: 33.9 g/dL (ref 32.0–36.0)
MCV: 91.6 fL (ref 80.0–100.0)
MONO ABS: 0.6 10*3/uL (ref 0.2–0.9)
MONOS PCT: 7 %
NEUTROS ABS: 5.4 10*3/uL (ref 1.4–6.5)
NEUTROS PCT: 59 %
Platelets: 219 10*3/uL (ref 150–440)
RBC: 4.34 MIL/uL (ref 3.80–5.20)
RDW: 13.1 % (ref 11.5–14.5)
WBC: 9.1 10*3/uL (ref 3.6–11.0)

## 2015-09-02 LAB — URINALYSIS COMPLETE WITH MICROSCOPIC (ARMC ONLY)
BACTERIA UA: NONE SEEN
SPECIFIC GRAVITY, URINE: 1.025 (ref 1.005–1.030)

## 2015-09-02 LAB — LIPASE, BLOOD: LIPASE: 104 U/L — AB (ref 11–51)

## 2015-09-02 MED ORDER — KETOROLAC TROMETHAMINE 30 MG/ML IJ SOLN
INTRAMUSCULAR | Status: AC
  Administered 2015-09-02: 30 mg via INTRAVENOUS
  Filled 2015-09-02: qty 1

## 2015-09-02 MED ORDER — HYDROMORPHONE HCL 2 MG PO TABS: 2.0000 mg | ORAL_TABLET | Freq: Four times a day (QID) | ORAL | Status: AC | PRN

## 2015-09-02 MED ORDER — KETOROLAC TROMETHAMINE 30 MG/ML IJ SOLN
30.0000 mg | Freq: Once | INTRAMUSCULAR | Status: AC
  Administered 2015-09-02: 30 mg via INTRAVENOUS
  Filled 2015-09-02: qty 1

## 2015-09-02 NOTE — ED Notes (Signed)
Lab called for add on of urine cult 

## 2015-09-02 NOTE — ED Notes (Signed)
Seen in ED about 1 week ago for c/o left side and left hip pain.  Patient diagnosed with nutcracker syndrome.  Patient had appointment with OBGYN surgeon today and appointment was cancelled.  C/o severe pain to left side.  Patient here for pain medication.

## 2015-09-02 NOTE — Discharge Instructions (Signed)
Pelvic Pain, Female °Female pelvic pain can be caused by many different things and start from a variety of places. Pelvic pain refers to pain that is located in the lower half of the abdomen and between your hips. The pain may occur over a short period of time (acute) or may be reoccurring (chronic). The cause of pelvic pain may be related to disorders affecting the female reproductive organs (gynecologic), but it may also be related to the bladder, kidney stones, an intestinal complication, or muscle or skeletal problems. Getting help right away for pelvic pain is important, especially if there has been severe, sharp, or a sudden onset of unusual pain. It is also important to get help right away because some types of pelvic pain can be life threatening.  °CAUSES  °Below are only some of the causes of pelvic pain. The causes of pelvic pain can be in one of several categories.  °· Gynecologic. °¨ Pelvic inflammatory disease. °¨ Sexually transmitted infection. °¨ Ovarian cyst or a twisted ovarian ligament (ovarian torsion). °¨ Uterine lining that grows outside the uterus (endometriosis). °¨ Fibroids, cysts, or tumors. °¨ Ovulation. °· Pregnancy. °¨ Pregnancy that occurs outside the uterus (ectopic pregnancy). °¨ Miscarriage. °¨ Labor. °¨ Abruption of the placenta or ruptured uterus. °· Infection. °¨ Uterine infection (endometritis). °¨ Bladder infection. °¨ Diverticulitis. °¨ Miscarriage related to a uterine infection (septic abortion). °· Bladder. °¨ Inflammation of the bladder (cystitis). °¨ Kidney stone(s). °· Gastrointestinal. °¨ Constipation. °¨ Diverticulitis. °· Neurologic. °¨ Trauma. °¨ Feeling pelvic pain because of mental or emotional causes (psychosomatic). °· Cancers of the bowel or pelvis. °EVALUATION  °Your caregiver will want to take a careful history of your concerns. This includes recent changes in your health, a careful gynecologic history of your periods (menses), and a sexual history. Obtaining  your family history and medical history is also important. Your caregiver may suggest a pelvic exam. A pelvic exam will help identify the location and severity of the pain. It also helps in the evaluation of which organ system may be involved. In order to identify the cause of the pelvic pain and be properly treated, your caregiver may order tests. These tests may include:  °· A pregnancy test. °· Pelvic ultrasonography. °· An X-ray exam of the abdomen. °· A urinalysis or evaluation of vaginal discharge. °· Blood tests. °HOME CARE INSTRUCTIONS  °· Only take over-the-counter or prescription medicines for pain, discomfort, or fever as directed by your caregiver.   °· Rest as directed by your caregiver.   °· Eat a balanced diet.   °· Drink enough fluids to make your urine clear or pale yellow, or as directed.   °· Avoid sexual intercourse if it causes pain.   °· Apply warm or cold compresses to the lower abdomen depending on which one helps the pain.   °· Avoid stressful situations.   °· Keep a journal of your pelvic pain. Write down when it started, where the pain is located, and if there are things that seem to be associated with the pain, such as food or your menstrual cycle. °· Follow up with your caregiver as directed.   °SEEK MEDICAL CARE IF: °· Your medicine does not help your pain. °· You have abnormal vaginal discharge. °SEEK IMMEDIATE MEDICAL CARE IF:  °· You have heavy bleeding from the vagina.   °· Your pelvic pain increases.   °· You feel light-headed or faint.   °· You have chills.   °· You have pain with urination or blood in your urine.   °· You have uncontrolled   diarrhea or vomiting.   You have a fever or persistent symptoms for more than 3 days.  You have a fever and your symptoms suddenly get worse.   You are being physically or sexually abused.   This information is not intended to replace advice given to you by your health care provider. Make sure you discuss any questions you have with  your health care provider.   Document Released: 05/26/2004 Document Revised: 03/20/2015 Document Reviewed: 10/19/2011 Elsevier Interactive Patient Education [employer]  Please return immediately if condition worsens. Please contact her primary physician or the physician you were given for referral. If you have any specialist physicians involved in her treatment and plan please also contact them. Thank you for using [facility].

## 2015-09-02 NOTE — ED Notes (Signed)
Pt reports severe left pelvic , hip and back pain that was not relieved from pain medications prescribed in ED last week. Pt reports having small amount of relief from meloxicam. Pt was dx with vaginosis as well but reports that she was not having symptoms of this and other than continuing to take antibiotics she has not noted any change in the past week.

## 2015-09-02 NOTE — ED Provider Notes (Signed)
Time Seen: Approximately 1805  I have reviewed the triage notes  Chief Complaint: Flank Pain   History of Present Illness: Kayla Garrison is a 30 y.o. female was seen and evaluated here in the emergency department recently and was diagnosed with bacterial vaginosis. Patient was also thought to possibly have pelvic inflammatory disease and was started on doxycycline. Patient states she did have planned follow-up with her OB/GYN but the appointment was canceled today and she still having persistent left-sided lower pelvic discomfort. She had an abdominal pelvic CT which was overall read as negative here recently and she states she was called at home and told that there was an addendum to her CAT scan and that may have showed some abnormality? Nut crackers syndrome? She states that she was told this through her [facility] clinic evaluation. The patient states she is currently having her menstrual period. She denies any changes in her menstrual period and denies any vaginal discharge or abnormal vaginal bleeding at this time. She points mainly to the left lower quadrant of her abdomen and states pain radiating up toward the left flank region. Denies any melena or hematochezia. She denies any dysuria or hematuria. She states that she's tried the medications that were prescribed and has not had any relief from her discomfort.  Past Medical History  Diagnosis Date  . Bipolar 1 disorder (HCC)   . Anxiety   . Migraines   . Concussion   . Sternal fracture   . Asthma     There are no active problems to display for this patient.   History reviewed. No pertinent past surgical history.  History reviewed. No pertinent past surgical history.  Current Outpatient Rx  Name  Route  Sig  Dispense  Refill  . cyclobenzaprine (FLEXERIL) 5 MG tablet   Oral   Take 1 tablet (5 mg total) by mouth every 8 (eight) hours as needed for muscle spasms.   12 tablet   0   . doxycycline (VIBRAMYCIN) 50 MG  capsule   Oral   Take 2 capsules (100 mg total) by mouth 2 (two) times daily.   40 capsule   0   . HYDROcodone-acetaminophen (NORCO) 5-325 MG per tablet   Oral   Take 1 tablet by mouth every 8 (eight) hours as needed for moderate pain.   10 tablet   0   . HYDROmorphone (DILAUDID) 2 MG tablet   Oral   Take 1 tablet (2 mg total) by mouth every 6 (six) hours as needed for severe pain.   28 tablet   0   . ketorolac (TORADOL) 10 MG tablet   Oral   Take 1 tablet (10 mg total) by mouth every 8 (eight) hours.   15 tablet   0   . ketorolac (TORADOL) 10 MG tablet   Oral   Take 1 tablet (10 mg total) by mouth every 8 (eight) hours as needed.   20 tablet   0   . metroNIDAZOLE (FLAGYL) 500 MG tablet   Oral   Take 1 tablet (500 mg total) by mouth 2 (two) times daily.   20 tablet   0   . naproxen (NAPROSYN) 500 MG tablet   Oral   Take 1 tablet (500 mg total) by mouth 2 (two) times daily with a meal.   20 tablet   0   . promethazine (PHENERGAN) 25 MG tablet   Oral   Take 1 tablet (25 mg total) by mouth every 6 (six) hours  as needed for nausea or vomiting.   20 tablet   0   . traMADol (ULTRAM) 50 MG tablet   Oral   Take 1 tablet (50 mg total) by mouth every 6 (six) hours as needed for moderate pain.   12 tablet   0     Allergies:  Eggs or egg-derived products  Family History: No family history on file.  Social History: Social History  Substance Use Topics  . Smoking status: Former Games developer  . Smokeless tobacco: Never Used  . Alcohol Use: No     Review of Systems:   10 point review of systems was performed and was otherwise negative:  Constitutional: No fever Eyes: No visual disturbances ENT: No sore throat, ear pain Cardiac: No chest pain Respiratory: No shortness of breath, wheezing, or stridor Abdomen: Left lower quadrant abdominal pain Endocrine: No weight loss, No night sweats Extremities: No peripheral edema, cyanosis Skin: No rashes, easy  bruising Neurologic: No focal weakness, trouble with speech or swollowing Urologic: No dysuria, Hematuria, or urinary frequency   Physical Exam:  ED Triage Vitals  Enc Vitals Group     BP 09/02/15 1618 117/71 mmHg     Pulse Rate 09/02/15 1616 81     Resp 09/02/15 1616 18     Temp 09/02/15 1616 98.2 F (36.8 C)     Temp Source 09/02/15 1616 Oral     SpO2 09/02/15 1616 100 %     Weight 09/02/15 1616 170 lb (77.111 kg)     Height 09/02/15 1616  (1.702 m)     Head Cir --      Peak Flow --      Pain Score 09/02/15 1619 8     Pain Loc --      Pain Edu? --      Excl. in GC? --     General: Awake , Alert , and Oriented times 3; GCS 15 patient does not appear to be in any apparent distress Head: Normal cephalic , atraumatic Eyes: Pupils equal , round, reactive to light Nose/Throat: No nasal drainage, patent upper airway without erythema or exudate.  Neck: Supple, Full range of motion, No anterior adenopathy or palpable thyroid masses Lungs: Clear to ascultation without wheezes , rhonchi, or rales Heart: Regular rate, regular rhythm without murmurs , gallops , or rubs Abdomen: Examination of her abdomen shows some tenderness toward the left groin area without any rebound guarding rigidity. There is no reproducible pain in the left flank area..        Extremities: 2 plus symmetric pulses. No edema, clubbing or cyanosis Neurologic: normal ambulation, Motor symmetric without deficits, sensory intact Skin: warm, dry, no rashes   Labs:   All laboratory work was reviewed including any pertinent negatives or positives listed below:  Labs Reviewed  COMPREHENSIVE METABOLIC PANEL - Abnormal; Notable for the following:    AST 13 (*)    ALT 10 (*)    All other components within normal limits  LIPASE, BLOOD - Abnormal; Notable for the following:    Lipase 104 (*)    All other components within normal limits  URINALYSIS COMPLETEWITH MICROSCOPIC ([facility] ONLY) - Abnormal; Notable for the  following:    Color, Urine RED (*)    APPearance CLOUDY (*)    Glucose, UA   (*)    Value: TEST NOT REPORTED DUE TO COLOR INTERFERENCE OF URINE PIGMENT   Bilirubin Urine   (*)    Value: TEST NOT REPORTED  DUE TO COLOR INTERFERENCE OF URINE PIGMENT   Ketones, ur   (*)    Value: TEST NOT REPORTED DUE TO COLOR INTERFERENCE OF URINE PIGMENT   Hgb urine dipstick   (*)    Value: TEST NOT REPORTED DUE TO COLOR INTERFERENCE OF URINE PIGMENT   Protein, ur   (*)    Value: TEST NOT REPORTED DUE TO COLOR INTERFERENCE OF URINE PIGMENT   Nitrite   (*)    Value: TEST NOT REPORTED DUE TO COLOR INTERFERENCE OF URINE PIGMENT   Leukocytes, UA   (*)    Value: TEST NOT REPORTED DUE TO COLOR INTERFERENCE OF URINE PIGMENT   Squamous Epithelial / LPF TOO NUMEROUS TO COUNT (*)    All other components within normal limits  URINE CULTURE  CBC WITH DIFFERENTIAL/PLATELET  POC URINE PREG, ED  POCT PREGNANCY, URINE   Review of laboratory work shows hematuria and a urine culture was added. Patient's laboratory work was otherwise negative  Radiology: *   EXAM: TRANSABDOMINAL AND TRANSVAGINAL ULTRASOUND OF PELVIS  TECHNIQUE: Both transabdominal and transvaginal ultrasound examinations of the pelvis were performed. Transabdominal technique was performed for global imaging of the pelvis including uterus, ovaries, adnexal regions, and pelvic cul-de-sac. It was necessary to proceed with endovaginal exam following the transabdominal exam to visualize the endometrium, left and right ovary.  COMPARISON: CT 08/24/2015  FINDINGS: Uterus  Measurements: 9.0 x 4.6 x 5.0 cm. No fibroids or other mass visualized.  Endometrium  Thickness: 3-5 mm. No focal abnormality visualized.  Right ovary  Measurements: 3.1 x 2.6 x 2.4 cm. Normal appearance/no adnexal mass. Blood flow seen.  Left ovary  Measurements: 2.6 x 1.3 x 1.6 cm. Normal appearance/no adnexal mass. Dominant follicle measures 1.3 cm. Blood flow  is seen.  Other findings  No abnormal free fluid.  IMPRESSION: Normal pelvic ultrasound.   Electronically Signed By: Rubye Oaks M.D. On: 09/02/2015 19:24          US Transvaginal Non-OB (Final result) Result time: 09/02/15 19:24:37   Final result by Rad Results In Interface (09/02/15 19:24:37)   Narrative:   CLINICAL DATA: Left lower quadrant, left pelvic pain for 9 days.  EXAM: TRANSABDOMINAL AND TRANSVAGINAL ULTRASOUND OF PELVIS  TECHNIQUE: Both transabdominal and transvaginal ultrasound examinations of the pelvis were performed. Transabdominal technique was performed for global imaging of the pelvis including uterus, ovaries, adnexal regions, and pelvic cul-de-sac. It was necessary to proceed with endovaginal exam following the transabdominal exam to visualize the endometrium, left and right ovary.  COMPARISON: CT 08/24/2015  FINDINGS: Uterus  Measurements: 9.0 x 4.6 x 5.0 cm. No fibroids or other mass visualized.  Endometrium  Thickness: 3-5 mm. No focal abnormality visualized.  Right ovary  Measurements: 3.1 x 2.6 x 2.4 cm. Normal appearance/no adnexal mass. Blood flow seen.  Left ovary  Measurements: 2.6 x 1.3 x 1.6 cm. Normal appearance/no adnexal mass. Dominant follicle measures 1.3 cm. Blood flow is seen.  Other findings  No abnormal free fluid.  IMPRESSION: Normal pelvic ultrasound.       I personally reviewed the radiologic studies I also reviewed the findings on her abdominal CAT scan that was done approximately a week ago which did not show any significant abnormalities nor could I find any updates on the reading.    ED Course:  Differential diagnosis includes but is not exclusive to ovarian cyst, ovarian torsion, acute appendicitis, urinary tract infection, endometriosis, bowel obstruction, colitis, renal colic, gastroenteritis, etc. Given the patient's clinical presentation  and objective findings are not sure  of the exact cause of her left-sided pelvic discomfort. We both agree they will repeat pelvic exam was not necessary and she's had an abdominal pelvic CT and I did not want to do any radiation on a young female child bearing age with just having a CAT scan done 1 week ago. I felt it would be unlikely it would show any new findings at this time. She doesn't have any risk factors for pelvic inflammatory disease and she does not appear to have an ovarian cyst or ovarian torsion. Adequate pain control though sleeping and talking comfortably here in emergency department she still says that she has significant uncontrolled pain. I felt with her not having a fever or any objective findings that she could be discharged with follow-up to OB/GYN. She is stating frustration her appointment got canceled today so requested another referral and states that her primary physician is at [facility] and she may try to follow up through her primary physician also. Advised to return if she develops a fever, right-sided abdominal pain, or any other new findings. She was advised that her urine culture is pending and she was given copies of her CAT scan reading along with the copy of her ultrasound reading that was performed today.    Assessment:  Acute exacerbation of left-sided pelvic pain in a female unknown etiology   Final Clinical Impression:   Final diagnoses:  Pelvic pain in female     Plan: Patient was advised to finish out the prescription for the Flagyl which I felt was not doing her any harm at this point and she does have clue cells on her wet prep from her previous evaluation. Patient was advised to return immediately if condition worsens. Patient was advised to follow up with their primary care physician or other specialized physicians involved in their outpatient care She was also prescribed Dilaudid for pain and advised take ibuprofen over-the-counter and is to stop the meloxicam that was prescribed for  left-sided osteoarthritic hip pain which I felt was unlikely to be her clinical issue            Jennye Moccasin, MD 09/02/15 2320

## 2015-09-05 LAB — URINE CULTURE

## 2015-09-20 ENCOUNTER — Other Ambulatory Visit: Payer: Self-pay | Admitting: Gastroenterology

## 2015-09-20 DIAGNOSIS — R9389 Abnormal findings on diagnostic imaging of other specified body structures: Secondary | ICD-10-CM

## 2015-09-26 ENCOUNTER — Ambulatory Visit
Admission: RE | Admit: 2015-09-26 | Discharge: 2015-09-26 | Disposition: A | Discharge status: Home / Self Care | Payer: BLUE CROSS/BLUE SHIELD | Source: Ambulatory Visit | Attending: Gastroenterology | Admitting: Gastroenterology

## 2015-09-26 ENCOUNTER — Encounter: Payer: Self-pay | Admitting: *Deleted

## 2015-09-26 DIAGNOSIS — R938 Abnormal findings on diagnostic imaging of other specified body structures: Secondary | ICD-10-CM | POA: Insufficient documentation

## 2015-09-26 DIAGNOSIS — R9389 Abnormal findings on diagnostic imaging of other specified body structures: Secondary | ICD-10-CM

## 2015-09-27 ENCOUNTER — Ambulatory Visit: Payer: BLUE CROSS/BLUE SHIELD | Admitting: Anesthesiology

## 2015-09-27 ENCOUNTER — Encounter: Payer: Self-pay | Admitting: *Deleted

## 2015-09-27 ENCOUNTER — Encounter
Admission: RE | Disposition: A | Discharge status: Home / Self Care | Payer: Self-pay | Source: Ambulatory Visit | Attending: Gastroenterology

## 2015-09-27 ENCOUNTER — Ambulatory Visit
Admission: RE | Admit: 2015-09-27 | Discharge: 2015-09-27 | Disposition: A | Discharge status: Home / Self Care | Payer: BLUE CROSS/BLUE SHIELD | Source: Ambulatory Visit | Attending: Gastroenterology | Admitting: Gastroenterology

## 2015-09-27 DIAGNOSIS — K2980 Duodenitis without bleeding: Secondary | ICD-10-CM | POA: Diagnosis not present

## 2015-09-27 DIAGNOSIS — Z91012 Allergy to eggs: Secondary | ICD-10-CM | POA: Insufficient documentation

## 2015-09-27 DIAGNOSIS — Z886 Allergy status to analgesic agent status: Secondary | ICD-10-CM | POA: Diagnosis not present

## 2015-09-27 DIAGNOSIS — F419 Anxiety disorder, unspecified: Secondary | ICD-10-CM | POA: Insufficient documentation

## 2015-09-27 DIAGNOSIS — K219 Gastro-esophageal reflux disease without esophagitis: Secondary | ICD-10-CM | POA: Insufficient documentation

## 2015-09-27 DIAGNOSIS — Z79899 Other long term (current) drug therapy: Secondary | ICD-10-CM | POA: Diagnosis not present

## 2015-09-27 DIAGNOSIS — K2950 Unspecified chronic gastritis without bleeding: Secondary | ICD-10-CM | POA: Diagnosis not present

## 2015-09-27 DIAGNOSIS — R6881 Early satiety: Secondary | ICD-10-CM | POA: Diagnosis present

## 2015-09-27 DIAGNOSIS — I739 Peripheral vascular disease, unspecified: Secondary | ICD-10-CM | POA: Diagnosis not present

## 2015-09-27 DIAGNOSIS — Z87891 Personal history of nicotine dependence: Secondary | ICD-10-CM | POA: Diagnosis not present

## 2015-09-27 DIAGNOSIS — K449 Diaphragmatic hernia without obstruction or gangrene: Secondary | ICD-10-CM | POA: Insufficient documentation

## 2015-09-27 DIAGNOSIS — F319 Bipolar disorder, unspecified: Secondary | ICD-10-CM | POA: Insufficient documentation

## 2015-09-27 HISTORY — DX: Aneurysm of renal artery: I72.2

## 2015-09-27 HISTORY — PX: ESOPHAGOGASTRODUODENOSCOPY (EGD) WITH PROPOFOL: SHX5813

## 2015-09-27 HISTORY — DX: Gastro-esophageal reflux disease without esophagitis: K21.9

## 2015-09-27 LAB — POCT PREGNANCY, URINE: Preg Test, Ur: NEGATIVE

## 2015-09-27 SURGERY — ESOPHAGOGASTRODUODENOSCOPY (EGD) WITH PROPOFOL
Anesthesia: General

## 2015-09-27 MED ORDER — FENTANYL CITRATE (PF) 100 MCG/2ML IJ SOLN
INTRAMUSCULAR | Status: DC | PRN
  Administered 2015-09-27: 100 ug via INTRAVENOUS

## 2015-09-27 MED ORDER — PROPOFOL 10 MG/ML IV BOLUS
INTRAVENOUS | Status: DC | PRN
  Administered 2015-09-27 (×3): 50 mg via INTRAVENOUS
  Administered 2015-09-27: 30 mg via INTRAVENOUS

## 2015-09-27 MED ORDER — IPRATROPIUM-ALBUTEROL 0.5-2.5 (3) MG/3ML IN SOLN: 3.0000 mL | Freq: Once | RESPIRATORY_TRACT | Status: DC

## 2015-09-27 MED ORDER — PROPOFOL 500 MG/50ML IV EMUL
INTRAVENOUS | Status: DC | PRN
  Administered 2015-09-27: 100 ug/kg/min via INTRAVENOUS

## 2015-09-27 MED ORDER — SODIUM CHLORIDE 0.9 % IV SOLN
INTRAVENOUS | Status: DC
  Administered 2015-09-27: 1000 mL via INTRAVENOUS

## 2015-09-27 MED ORDER — MIDAZOLAM HCL 2 MG/2ML IJ SOLN: INTRAMUSCULAR | Status: DC | PRN

## 2015-09-27 MED ORDER — MIDAZOLAM HCL 2 MG/2ML IJ SOLN
INTRAMUSCULAR | Status: DC | PRN
  Administered 2015-09-27: 2 mg via INTRAVENOUS

## 2015-09-27 NOTE — Anesthesia Postprocedure Evaluation (Signed)
Anesthesia Post Note  Patient: Kayla Garrison  Procedure(s) Performed: Procedure(s) (LRB): ESOPHAGOGASTRODUODENOSCOPY (EGD) WITH PROPOFOL (N/A)  Patient location during evaluation: Endoscopy Anesthesia Type: General Level of consciousness: awake and alert Pain management: pain level controlled Vital Signs Assessment: post-procedure vital signs reviewed and stable Respiratory status: spontaneous breathing, nonlabored ventilation, respiratory function stable and patient connected to nasal cannula oxygen Cardiovascular status: blood pressure returned to baseline and stable Postop Assessment: no signs of nausea or vomiting Anesthetic complications: no    Last Vitals:  Filed Vitals:   09/27/15 1233 09/27/15 1243  BP: 111/72 105/62  Pulse: 66 66  Temp:    Resp: 16 19    Last Pain:  Filed Vitals:   09/27/15 1248  PainSc: 4                  Kayla Garrison

## 2015-09-27 NOTE — Transfer of Care (Signed)
Immediate Anesthesia Transfer of Care Note  Patient: Kayla Garrison  Procedure(s) Performed: Procedure(s): ESOPHAGOGASTRODUODENOSCOPY (EGD) WITH PROPOFOL (N/A)  Patient Location: PACU  Anesthesia Type:General  Level of Consciousness: awake, alert  and oriented  Airway & Oxygen Therapy: Patient Spontanous Breathing and Patient connected to nasal cannula oxygen  Post-op Assessment: Report given to RN and Post -op Vital signs reviewed and stable  Post vital signs: Reviewed and stable  Last Vitals:  Filed Vitals:   09/27/15 1103 09/27/15 1213  BP: 114/69 113/71  Pulse: 79 94  Temp: 35.6 C 35.9 C  Resp: 16 12    Complications: No apparent anesthesia complications

## 2015-09-27 NOTE — H&P (Signed)
  Primary Care Physician:  Altha HarmVALENTINE, ALLISON, MD  Pre-Procedure History & Physical: HPI:  Kayla Garrison is a 30 y.o. female is here for an endoscopy.   Past Medical History  Diagnosis Date  . Bipolar 1 disorder (HCC)   . Anxiety   . Migraines   . Concussion   . Sternal fracture   . Asthma   . Acid reflux   . 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 No past surgeries    . Esophagogastroduodenoscopy endoscopy      Prior to Admission medications   Medication Sig Start Date End Date Taking? Authorizing Provider  ARIPiprazole (ABILIFY) 10 MG tablet Take 10 mg by mouth daily.   Yes Historical Provider, MD  clonazePAM (KLONOPIN) 0.5 MG tablet Take 0.5 mg by mouth 2 (two) times daily as needed for anxiety.   Yes Historical Provider, MD  esomeprazole (NEXIUM) 40 MG capsule Take 40 mg by mouth daily at 12 noon.   Yes Historical Provider, MD  meloxicam (MOBIC) 15 MG tablet Take 15 mg by mouth daily.   Yes Historical Provider, MD  topiramate (TOPAMAX) 100 MG tablet Take 100 mg by mouth 2 (two) times daily.   Yes Historical Provider, MD  venlafaxine XR (EFFEXOR-XR) 75 MG 24 hr capsule Take 75 mg by mouth daily with breakfast.   Yes Historical Provider, MD  cyclobenzaprine (FLEXERIL) 5 MG tablet Take 1 tablet (5 mg total) by mouth every 8 (eight) hours as needed for muscle spasms. Patient not taking: Reported on 09/27/2015 03/13/15   Charlesetta IvoryJenise V Bacon Menshew, PA-C    Allergies as of 09/26/2015 - Review Complete 09/26/2015  Allergen Reaction Noted  . Eggs or egg-derived products  05/31/2015  . Ibuprofen  09/26/2015    History reviewed. No pertinent family history.  Social History   Social History  . Marital Status: Married    Spouse Name: N/A  . Number of Children: N/A  . Years of Education: N/A   Occupational History  . Not on file.   Social History Main Topics  . Smoking status: Former Games developermoker  . Smokeless tobacco: Never Used  .  Alcohol Use: Yes  . Drug Use: No  . Sexual Activity: Not on file   Other Topics Concern  . Not on file   Social History Narrative     Physical Exam: BP 114/69 mmHg  Pulse 79  Temp(Src) 96.1 F (35.6 C) (Tympanic)  Resp 16  Ht 5\' 7"  (1.702 m)  Wt 77.111 kg (170 lb)  BMI 26.62 kg/m2  SpO2 100%  LMP 09/01/2015 General:   Alert,  pleasant and cooperative in NAD Head:  Normocephalic and atraumatic. Neck:  Supple; no masses or thyromegaly. Lungs:  Clear throughout to auscultation.    Heart:  Regular rate and rhythm. Abdomen:  Soft, nontender and nondistended. Normal bowel sounds, without guarding, and without rebound.   Neurologic:  Alert and  oriented x4;  grossly normal neurologically.  Impression/Plan: Kayla Garrison is here for an endoscopy to be performed for abd pain, early satiety, abnormal CT  Risks, benefits, limitations, and alternatives regarding  endoscopy have been reviewed with the patient.  Questions have been answered.  All parties agreeable.   Elnita MaxwellEIN, MATTHEW GORDON, MD  09/27/2015, 11:35 AM
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
## 2015-09-30 LAB — SURGICAL PATHOLOGY

## 2015-10-02 ENCOUNTER — Encounter: Payer: Self-pay | Admitting: Gastroenterology

## 2015-11-06 ENCOUNTER — Encounter: Payer: Self-pay | Admitting: Emergency Medicine

## 2015-11-06 ENCOUNTER — Emergency Department
Admission: EM | Admit: 2015-11-06 | Discharge: 2015-11-06 | Disposition: A | Discharge status: Home / Self Care | Payer: BLUE CROSS/BLUE SHIELD | Attending: Emergency Medicine | Admitting: Emergency Medicine

## 2015-11-06 ENCOUNTER — Emergency Department: Payer: BLUE CROSS/BLUE SHIELD

## 2015-11-06 DIAGNOSIS — Z87891 Personal history of nicotine dependence: Secondary | ICD-10-CM | POA: Insufficient documentation

## 2015-11-06 DIAGNOSIS — J45909 Unspecified asthma, uncomplicated: Secondary | ICD-10-CM | POA: Diagnosis not present

## 2015-11-06 DIAGNOSIS — F319 Bipolar disorder, unspecified: Secondary | ICD-10-CM | POA: Insufficient documentation

## 2015-11-06 DIAGNOSIS — Z79899 Other long term (current) drug therapy: Secondary | ICD-10-CM | POA: Insufficient documentation

## 2015-11-06 DIAGNOSIS — R079 Chest pain, unspecified: Secondary | ICD-10-CM | POA: Insufficient documentation

## 2015-11-06 DIAGNOSIS — Z791 Long term (current) use of non-steroidal anti-inflammatories (NSAID): Secondary | ICD-10-CM | POA: Diagnosis not present

## 2015-11-06 LAB — BASIC METABOLIC PANEL
Anion gap: 8 (ref 5–15)
BUN: 13 mg/dL (ref 6–20)
CHLORIDE: 106 mmol/L (ref 101–111)
CO2: 26 mmol/L (ref 22–32)
CREATININE: 0.64 mg/dL (ref 0.44–1.00)
Calcium: 9.5 mg/dL (ref 8.9–10.3)
Glucose, Bld: 89 mg/dL (ref 65–99)
Potassium: 3.8 mmol/L (ref 3.5–5.1)
SODIUM: 140 mmol/L (ref 135–145)

## 2015-11-06 LAB — TROPONIN I

## 2015-11-06 LAB — CBC
HCT: 39.6 % (ref 35.0–47.0)
Hemoglobin: 13.6 g/dL (ref 12.0–16.0)
MCH: 31.1 pg (ref 26.0–34.0)
MCHC: 34.4 g/dL (ref 32.0–36.0)
MCV: 90.5 fL (ref 80.0–100.0)
PLATELETS: 232 10*3/uL (ref 150–440)
RBC: 4.38 MIL/uL (ref 3.80–5.20)
RDW: 13.7 % (ref 11.5–14.5)
WBC: 6.6 10*3/uL (ref 3.6–11.0)

## 2015-11-06 NOTE — ED Provider Notes (Signed)
[facility] Emergency Department Provider Note  Time seen: 12:44 PM  I have reviewed the triage vital signs and the nursing notes.   HISTORY  Chief Complaint Chest Pain and Shortness of Breath    HPI Autumne Kallio is a 30 y.o. female with a past medical history of bipolar, anxiety, asthma presents the emergency department or shortness of breath and chest discomfort. According to the patient since early this morning she has been feeling a slight discomfort in the center of her chest and feeling like she cannot get a full breath of air. States a history of asthma but states she does not feel like an asthma exacerbation. She also states a history of anxiety which has presented similarly in the past but she took her prescribed Klonopin his morning and the symptoms have not gone away. As a secondary complaint the patient states she is currently undergoing a GI workup at [facility] clinic for a 40 pound weight loss over the past few months. 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 Denies any exacerbation of those symptoms today. Patient states very slight chest discomfort currently, states her shortness of breath is improved but she still feels she cannot get a full breath. Denies any pleuritic component to the chest pain. States she has been having a dry cough over the last few days but denies any sputum production or fever.     Past Medical History  Diagnosis Date  . Bipolar 1 disorder (HCC)   . Anxiety   . Migraines   . Concussion   . Sternal fracture   . Asthma   . Acid reflux   . Renal arterial aneurysm (HCC)     There are no active problems to display for this patient.   Past Surgical History  Procedure Laterality Date  . No past surgeries    . Esophagogastroduodenoscopy endoscopy     . Esophagogastroduodenoscopy (egd) with propofol N/A 09/27/2015    Procedure: ESOPHAGOGASTRODUODENOSCOPY (EGD) WITH PROPOFOL;  Surgeon: Elnita Maxwell, MD;  Location: [facility] ENDOSCOPY;  Service: Endoscopy;  Laterality: N/A;    Current Outpatient Rx  Name  Route  Sig  Dispense  Refill  . ARIPiprazole (ABILIFY) 10 MG tablet   Oral   Take 10 mg by mouth daily.         . clonazePAM (KLONOPIN) 0.5 MG tablet   Oral   Take 0.5 mg by mouth 2 (two) times daily as needed for anxiety.         . cyclobenzaprine (FLEXERIL) 5 MG tablet   Oral   Take 1 tablet (5 mg total) by mouth every 8 (eight) hours as needed for muscle spasms. Patient not taking: Reported on 09/27/2015   12 tablet   0   . esomeprazole (NEXIUM) 40 MG capsule   Oral   Take 40 mg by mouth daily at 12 noon.         . meloxicam (MOBIC) 15 MG tablet   Oral   Take 15 mg by mouth daily.         Marland Kitchen topiramate (TOPAMAX) 100 MG tablet   Oral   Take 100 mg by mouth 2 (two) times daily.         Marland Kitchen venlafaxine XR (EFFEXOR-XR) 75 MG 24 hr capsule   Oral  Take 75 mg by mouth daily with breakfast.           Allergies Eggs or egg-derived products and Ibuprofen  No family history on file.  Social History Social History  Substance Use Topics  . Smoking status: Former Games developermoker  . Smokeless tobacco: Never Used  . Alcohol Use: Yes    Review of Systems Constitutional: Negative for fever. Cardiovascular: Positive for chest pain. Respiratory: Positive for shortness of breath. Gastrointestinal: Negative for abdominal pain, vomiting and diarrhea. Neurological: Negative for headache 10-point ROS otherwise negative.  ____________________________________________   PHYSICAL EXAM:  VITAL SIGNS: ED Triage Vitals  Enc Vitals Group     BP 11/06/15 1126 116/74 mmHg     Pulse Rate 11/06/15 1126 74     Resp 11/06/15 1126 20     Temp 11/06/15 1126 97.8 F (36.6 C)     Temp Source 11/06/15 1126 Oral     SpO2  11/06/15 1126 98 %     Weight 11/06/15 1126 169 lb (76.658 kg)     Height 11/06/15 1126 5\' 6"  (1.676 m)     Head Cir --      Peak Flow --      Pain Score 11/06/15 1124 5     Pain Loc --      Pain Edu? --      Excl. in GC? --    Constitutional: Alert and oriented. Well appearing and in no distress. Eyes: Normal exam ENT   Head: Normocephalic and atraumatic.   Mouth/Throat: Mucous membranes are moist. Cardiovascular: Normal rate, regular rhythm. No murmur Respiratory: Normal respiratory effort without tachypnea nor retractions. Breath sounds are clear. Mild central chest tenderness to palpation. Gastrointestinal: Soft and nontender. No distention.  Musculoskeletal: Nontender with normal range of motion in all extremities. No swelling or edema. Neurologic:  Normal speech and language. No gross focal neurologic deficit Skin:  Skin is warm, dry and intact.  Psychiatric: Mood and affect are normal. Speech and behavior are normal.  ____________________________________________    EKG  EKG reviewed and interpreted by myself shows normal sinus rhythm at 72 bpm, narrow QRS, normal axis, normal intervals, nonspecific but no concerning ST changes.  ____________________________________________    RADIOLOGY  Chest x-ray negative  ____________________________________________   INITIAL IMPRESSION / ASSESSMENT AND PLAN / ED COURSE  Pertinent labs & imaging results that were available during my care of the patient were reviewed by me and considered in my medical decision making (see chart for details).  Patient presents to the emergency department with chest discomfort and shortness of breath. Patient's workup is normal including labs, chest x-ray, EKG. Patient appears very well currently. States very slight chest discomfort much improved from this morning. States a history of anxiety to which this feels similar. I discussed with the patient the need to follow up with GI medicine, as  well as her primary care doctor. The patient is agreeable. I discussed return precautions for her chest pain, which the patient is agreeable as well. Patient is perc negative  ____________________________________________   FINAL CLINICAL IMPRESSION(S) / ED DIAGNOSES  Nonspecific chest pain   Minna AntisKevin Janyia Guion, MD 11/06/15 1251

## 2015-11-06 NOTE — Discharge Instructions (Signed)
You have been seen in the emergency department today for chest pain. Your workup has shown normal results. As we discussed please follow-up with your primary care physician in the next 1-2 days for recheck. Return to the emergency department for any further chest pain, trouble breathing, or any other symptom personally concerning to yourself. ° °Nonspecific Chest Pain °It is often hard to find the cause of chest pain. There is always a chance that your pain could be related to something serious, such as a heart attack or a blood clot in your lungs. Chest pain can also be caused by conditions that are not life-threatening. If you have chest pain, it is very important to follow up with your doctor. ° °HOME CARE °· If you were prescribed an antibiotic medicine, finish it all even if you start to feel better. °· Avoid any activities that cause chest pain. °· Do not use any tobacco products, including cigarettes, chewing tobacco, or electronic cigarettes. If you need help quitting, ask your doctor. °· Do not drink alcohol. °· Take medicines only as told by your doctor. °· Keep all follow-up visits as told by your doctor. This is important. This includes any further testing if your chest pain does not go away. °· Your doctor may tell you to keep your head raised (elevated) while you sleep. °· Make lifestyle changes as told by your doctor. These may include: °¨ Getting regular exercise. Ask your doctor to suggest some activities that are safe for you. °¨ Eating a heart-healthy diet. Your doctor or a diet specialist (dietitian) can help you to learn healthy eating options. °¨ Maintaining a healthy weight. °¨ Managing diabetes, if necessary. °¨ Reducing stress. °GET HELP IF: °· Your chest pain does not go away, even after treatment. °· You have a rash with blisters on your chest. °· You have a fever. °GET HELP RIGHT AWAY IF: °· Your chest pain is worse. °· You have an increasing cough, or you cough up blood. °· You have  severe belly (abdominal) pain. °· You feel extremely weak. °· You pass out (faint). °· You have chills. °· You have sudden, unexplained chest discomfort. °· You have sudden, unexplained discomfort in your arms, back, neck, or jaw. °· You have shortness of breath at any time. °· You suddenly start to sweat, or your skin gets clammy. °· You feel nauseous. °· You vomit. °· You suddenly feel light-headed or dizzy. °· Your heart begins to beat quickly, or it feels like it is skipping beats. °These symptoms may be an emergency. Do not wait to see if the symptoms will go away. Get medical help right away. Call your local emergency services (911 in the [place] Do not drive yourself to the hospital. °  °This information is not intended to replace advice given to you by your health care provider. Make sure you discuss any questions you have with your health care provider. °  °Document Released: 12/16/2007 Document Revised: 07/20/2014 Document Reviewed: 02/02/2014 °Elsevier Interactive Patient Education ©2016 [employer] ° °

## 2015-11-06 NOTE — ED Notes (Signed)
Patient states [facility] for an unint[facility] weight loss of 40 lbs in the last 3 months. Patient states she feels full easily and will vomit if she eats too much.

## 2015-11-06 NOTE — ED Notes (Signed)
Pt presents with central chest pain and shortness of breath started this am. Pt with hx of anxiety and sometimes get palpatations.

## 2015-11-13 ENCOUNTER — Ambulatory Visit: Payer: BLUE CROSS/BLUE SHIELD | Admitting: Neurology

## 2016-02-07 ENCOUNTER — Emergency Department: Payer: Medicaid Other

## 2016-02-07 ENCOUNTER — Emergency Department
Admission: EM | Admit: 2016-02-07 | Discharge: 2016-02-07 | Disposition: A | Discharge status: Home / Self Care | Payer: Medicaid Other | Attending: Student | Admitting: Student

## 2016-02-07 ENCOUNTER — Encounter: Payer: Self-pay | Admitting: Emergency Medicine

## 2016-02-07 DIAGNOSIS — R079 Chest pain, unspecified: Secondary | ICD-10-CM

## 2016-02-07 DIAGNOSIS — F419 Anxiety disorder, unspecified: Secondary | ICD-10-CM | POA: Diagnosis not present

## 2016-02-07 DIAGNOSIS — Z87891 Personal history of nicotine dependence: Secondary | ICD-10-CM | POA: Insufficient documentation

## 2016-02-07 DIAGNOSIS — R0602 Shortness of breath: Secondary | ICD-10-CM | POA: Diagnosis present

## 2016-02-07 DIAGNOSIS — J45909 Unspecified asthma, uncomplicated: Secondary | ICD-10-CM | POA: Diagnosis not present

## 2016-02-07 DIAGNOSIS — R0789 Other chest pain: Secondary | ICD-10-CM | POA: Insufficient documentation

## 2016-02-07 LAB — CBC
HCT: 41.5 % (ref 35.0–47.0)
HEMOGLOBIN: 14.2 g/dL (ref 12.0–16.0)
MCH: 31.2 pg (ref 26.0–34.0)
MCHC: 34.4 g/dL (ref 32.0–36.0)
MCV: 90.9 fL (ref 80.0–100.0)
Platelets: 192 10*3/uL (ref 150–440)
RBC: 4.56 MIL/uL (ref 3.80–5.20)
RDW: 12.8 % (ref 11.5–14.5)
WBC: 6.8 10*3/uL (ref 3.6–11.0)

## 2016-02-07 LAB — BASIC METABOLIC PANEL
ANION GAP: 12 (ref 5–15)
BUN: 11 mg/dL (ref 6–20)
CALCIUM: 9.1 mg/dL (ref 8.9–10.3)
CO2: 19 mmol/L — ABNORMAL LOW (ref 22–32)
Chloride: 107 mmol/L (ref 101–111)
Creatinine, Ser: 0.55 mg/dL (ref 0.44–1.00)
GFR calc Af Amer: >60 mL/min (ref >60)
GLUCOSE: 79 mg/dL (ref 65–99)
POTASSIUM: 4.2 mmol/L (ref 3.5–5.1)
SODIUM: 138 mmol/L (ref 135–145)

## 2016-02-07 LAB — TROPONIN I

## 2016-02-07 MED ORDER — LORAZEPAM 1 MG PO TABS
1.0000 mg | ORAL_TABLET | Freq: Once | ORAL | Status: AC
  Administered 2016-02-07: 1 mg via ORAL
  Filled 2016-02-07: qty 1
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
## 2016-02-07 NOTE — ED Notes (Signed)
Pt in via triage with complaints of centralized chest pain radiating into left jaw since last night, pt reports waking up multiple times through the night with the jaw pain, diaphorsis, light headedness.  Pt states, "I felt like my heart was beating through my ear."  Pt reports waking up this morning, still with the chest pain and "I just don't feel like myself."  Pt with hx of anxiety, panic attacks and reports being under a lot of stress recently, but she states, "this feels different."  Pt A/Ox4, no immediate distress at this time.

## 2016-02-23 ENCOUNTER — Emergency Department
Admission: EM | Admit: 2016-02-23 | Discharge: 2016-02-23 | Disposition: A | Discharge status: Home / Self Care | Payer: Medicaid Other | Attending: Emergency Medicine | Admitting: Emergency Medicine

## 2016-02-23 ENCOUNTER — Emergency Department: Payer: Medicaid Other

## 2016-02-23 ENCOUNTER — Encounter: Payer: Self-pay | Admitting: Emergency Medicine

## 2016-02-23 DIAGNOSIS — Z87891 Personal history of nicotine dependence: Secondary | ICD-10-CM | POA: Insufficient documentation

## 2016-02-23 DIAGNOSIS — R531 Weakness: Secondary | ICD-10-CM | POA: Insufficient documentation

## 2016-02-23 DIAGNOSIS — J45909 Unspecified asthma, uncomplicated: Secondary | ICD-10-CM | POA: Insufficient documentation

## 2016-02-23 DIAGNOSIS — R079 Chest pain, unspecified: Secondary | ICD-10-CM | POA: Insufficient documentation

## 2016-02-23 LAB — URINALYSIS COMPLETE WITH MICROSCOPIC (ARMC ONLY)
BILIRUBIN URINE: NEGATIVE
Bacteria, UA: NONE SEEN
Glucose, UA: NEGATIVE mg/dL
HGB URINE DIPSTICK: NEGATIVE
KETONES UR: NEGATIVE mg/dL
NITRITE: NEGATIVE
PH: 5 (ref 5.0–8.0)
PROTEIN: NEGATIVE mg/dL
SPECIFIC GRAVITY, URINE: 1.014 (ref 1.005–1.030)

## 2016-02-23 LAB — BASIC METABOLIC PANEL
ANION GAP: 8 (ref 5–15)
BUN: 18 mg/dL (ref 6–20)
CHLORIDE: 109 mmol/L (ref 101–111)
CO2: 23 mmol/L (ref 22–32)
Calcium: 9.4 mg/dL (ref 8.9–10.3)
Creatinine, Ser: 0.6 mg/dL (ref 0.44–1.00)
GFR calc non Af Amer: >60 mL/min (ref >60)
GLUCOSE: 85 mg/dL (ref 65–99)
POTASSIUM: 4 mmol/L (ref 3.5–5.1)
Sodium: 140 mmol/L (ref 135–145)

## 2016-02-23 LAB — TROPONIN I

## 2016-02-23 LAB — CBC
HCT: 37.7 % (ref 35.0–47.0)
HEMOGLOBIN: 13.3 g/dL (ref 12.0–16.0)
MCH: 31.5 pg (ref 26.0–34.0)
MCHC: 35.3 g/dL (ref 32.0–36.0)
MCV: 89.1 fL (ref 80.0–100.0)
Platelets: 235 10*3/uL (ref 150–440)
RBC: 4.23 MIL/uL (ref 3.80–5.20)
RDW: 13.2 % (ref 11.5–14.5)
WBC: 6.7 10*3/uL (ref 3.6–11.0)

## 2016-02-23 LAB — POCT PREGNANCY, URINE: PREG TEST UR: NEGATIVE

## 2016-02-23 MED ORDER — LORAZEPAM 1 MG PO TABS
1.0000 mg | ORAL_TABLET | Freq: Once | ORAL | Status: AC
  Administered 2016-02-23: 1 mg via ORAL

## 2016-02-23 MED ORDER — SODIUM CHLORIDE 0.9 % IV BOLUS (SEPSIS)
1000.0000 mL | Freq: Once | INTRAVENOUS | Status: AC
  Administered 2016-02-23: 1000 mL via INTRAVENOUS

## 2016-02-23 NOTE — ED Notes (Signed)
Pt changed into gown, placed on monitor and given warm blanket.

## 2016-02-23 NOTE — ED Provider Notes (Signed)
[facility] Emergency Department Provider Note  Time seen: 1:16 PM  I have reviewed the triage vital signs and the nursing notes.   HISTORY  Chief Complaint Chest Pain    HPI Kayla Garrison is a 30 y.o. female with a past medical history of gastric reflux, anxiety, bipolar, who presents the emergency department with chest pain and generalized fatigue. According to the patient for the past 2 or 3 days she has been spitting chest pain. She states the chest pain is something new for her, she has had chest pain ongoing for several months. She is seeing a cardiologist but states they have not found a cause for her chest pain yet. She also states today she has a feeling of generalized fatigue is feeling very weak. Denies any focal deficits. Denies any headache, nausea, vomiting, diarrhea, dysuria, fever. Overall the patient appears well, no distress.  Past Medical History:  Diagnosis Date  . Acid reflux   . Anxiety   . Asthma    As child  . Bipolar 1 disorder (HCC)   . Concussion   . Migraines   . Renal arterial aneurysm (HCC)   . Sternal fracture     There are no active problems to display for this patient.   Past Surgical History:  Procedure Laterality Date  . ESOPHAGOGASTRODUODENOSCOPY (EGD) WITH PROPOFOL N/A 09/27/2015   Procedure: ESOPHAGOGASTRODUODENOSCOPY (EGD) WITH PROPOFOL;  Surgeon: Elnita Maxwell, MD;  Location: [facility] ENDOSCOPY;  Service: Endoscopy;  Laterality: N/A;  . ESOPHAGOGASTRODUODENOSCOPY ENDOSCOPY    . NO PAST SURGERIES      Prior to Admission medications   Medication Sig Start Date End Date Taking? Authorizing Provider  ARIPiprazole (ABILIFY) 10 MG tablet Take 10 mg by mouth daily.    Historical Provider, MD  clonazePAM (KLONOPIN) 0.5 MG tablet Take 0.5 mg by mouth 2 (two) times daily as needed for anxiety.    Historical Provider, MD  cyclobenzaprine (FLEXERIL) 5 MG tablet Take 1 tablet (5 mg total) by mouth every 8 (eight) hours as  needed for muscle spasms. Patient not taking: Reported on 09/27/2015 03/13/15   Smith Robert Bacon Menshew, PA-C  esomeprazole (NEXIUM) 40 MG capsule Take 40 mg by mouth daily at 12 noon.    Historical Provider, MD  meloxicam (MOBIC) 15 MG tablet Take 15 mg by mouth daily.    Historical Provider, MD  topiramate (TOPAMAX) 100 MG tablet Take 100 mg by mouth 2 (two) times daily.    Historical Provider, MD  venlafaxine XR (EFFEXOR-XR) 75 MG 24 hr capsule Take 75 mg by mouth daily with breakfast.    Historical Provider, MD    Allergies  Allergen Reactions  . Eggs Or Egg-Derived Products Hives  . Ibuprofen     Stomach complaints    History reviewed. No pertinent family history.  Social History Social History  Substance Use Topics  . Smoking status: Former Games developer  . Smokeless tobacco: Never Used  . Alcohol use Yes    Review of Systems Constitutional: Negative for fever.Positive for Generalized weakness. Cardiovascular: Negative for chest pain. Respiratory: Negative for shortness of breath. Gastrointestinal: Negative for abdominal pain Musculoskeletal: Negative for back pain. Neurological: Negative for headache 10-point ROS otherwise negative.  ____________________________________________   PHYSICAL EXAM:  VITAL SIGNS: ED Triage Vitals [02/23/16 1116]  Enc Vitals Group     BP 114/70     Pulse Rate 77     Resp 18     Temp 98.1 F (36.7 C)  Temp Source Oral     SpO2 99 %     Weight 165 lb (74.8 kg)     Height 5\' 6"  (1.676 m)     Head Circumference      Peak Flow      Pain Score 6     Pain Loc      Pain Edu?      Excl. in GC?     Constitutional: Alert and oriented. Well appearing and in no distress. Eyes: Normal exam ENT   Head: Normocephalic and atraumatic   Mouth/Throat: Mucous membranes are moist. Cardiovascular: Normal rate, regular rhythm. No murmur Respiratory: Normal respiratory effort without tachypnea nor retractions. Breath sounds are  clear Gastrointestinal: Soft and nontender. No distention.  Musculoskeletal: Nontender with normal range of motion in all extremities. Neurologic:  Normal speech and language. No gross focal neurologic deficits are appreciated. Skin:  Skin is warm, dry and intact.  Psychiatric: Mood and affect are normal. Speech and behavior are normal.   ____________________________________________    EKG  EKG reviewed and interpreted by myself shows normal sinus rhythm at 81 bpm, narrow QRS, normal axis, normal intervals, no concerning ST changes. Overall normal EKG.  ____________________________________________    RADIOLOGY  Chest x-ray negative  ____________________________________________   INITIAL IMPRESSION / ASSESSMENT AND PLAN / ED COURSE  Pertinent labs & imaging results that were available during my care of the patient were reviewed by me and considered in my medical decision making (see chart for details).  Patient presents the emergency department with generalized weakness, on off chest pain for the past few days. Patient has a very normal physical exam, appears well, no distress. Patient's labs are within normal limits. Troponin negative. EKG is reassuring and chest x-ray is clear. I have added on a urinalysis as well as a POC pregnancy test. Patient currently sees a cardiologist for ongoing chest discomfort. We'll IV hydrate the patient while awaiting results. Patient agreeable plan.  Labs are normal. Urinalysis normal. We will discharge home with cardiology follow-up. Patient agreeable to plan.  ____________________________________________   FINAL CLINICAL IMPRESSION(S) / ED DIAGNOSES  Chest pain Generalized weakness    Minna AntisKevin Jode Lippe, MD 02/23/16 1504

## 2016-02-23 NOTE — ED Notes (Signed)
NOTIFIED MD of pt's recurrent chest pain. No new orders at this time

## 2016-02-23 NOTE — Discharge Instructions (Signed)
You have been seen in the emergency department today for chest pain. Your workup has shown normal results. As we discussed please follow-up with your primary care physician in the next 1-2 days for recheck. Return to the emergency department for any further chest pain, trouble breathing, or any other symptom personally concerning to yourself. °

## 2016-02-23 NOTE — ED Triage Notes (Addendum)
Pt to ER for c/o chest pain that started this morning. Pt has had off and on chest pain for the past few weeks and was recently seen here for same. Was referred to cardiology and placed on Holter Monitor, but was only for 24 hours before there was a battery malfunction. Pt states the pain is worse than it is when she has an anxiety attack. Pt did take prescribed Klonopin about 1 hour PTA.

## 2016-02-25 ENCOUNTER — Other Ambulatory Visit: Payer: Self-pay | Admitting: Cardiology

## 2016-02-25 DIAGNOSIS — R079 Chest pain, unspecified: Secondary | ICD-10-CM

## 2016-02-26 ENCOUNTER — Ambulatory Visit: Payer: Medicaid Other

## 2016-02-27 ENCOUNTER — Ambulatory Visit
Admission: RE | Admit: 2016-02-27 | Discharge: 2016-02-27 | Disposition: A | Discharge status: Home / Self Care | Payer: Medicaid Other | Source: Ambulatory Visit | Attending: Cardiology | Admitting: Cardiology

## 2016-02-27 DIAGNOSIS — R079 Chest pain, unspecified: Secondary | ICD-10-CM | POA: Diagnosis present

## 2016-02-27 DIAGNOSIS — R0602 Shortness of breath: Secondary | ICD-10-CM | POA: Diagnosis present

## 2016-02-27 NOTE — Procedures (Signed)
Treadmill Stress performed without Nuc. Med.  Resting HR 83; Resting BP 106/62; Total Exercise time 07:30; Max HR 166; Max BP 124/82; METS 9.30; MPHR 189; MPHR% 87%  Target HR achieved.

## 2016-02-27 NOTE — Progress Notes (Signed)
*  PRELIMINARY RESULTS* Echocardiogram 2D Echocardiogram has been performed.  Cristela BlueHege, Alexx Giambra 02/27/2016, 11:05 AM

## 2016-11-19 IMAGING — CR DG CHEST 2V
1 series · 2 of 2 positions shown · non-contrast
Comparison: 02/07/2016

CLINICAL DATA: Chest pain beginning this morning, chest pain off
and on for past few weeks, increased pain with anxiety attacks,
former smoker, history asthma, acid reflux

EXAM:
CHEST  2 VIEW

[Series 1: w chest pa · 0.14mm/px · 2 of 2 slices shown]
[im 1/2]
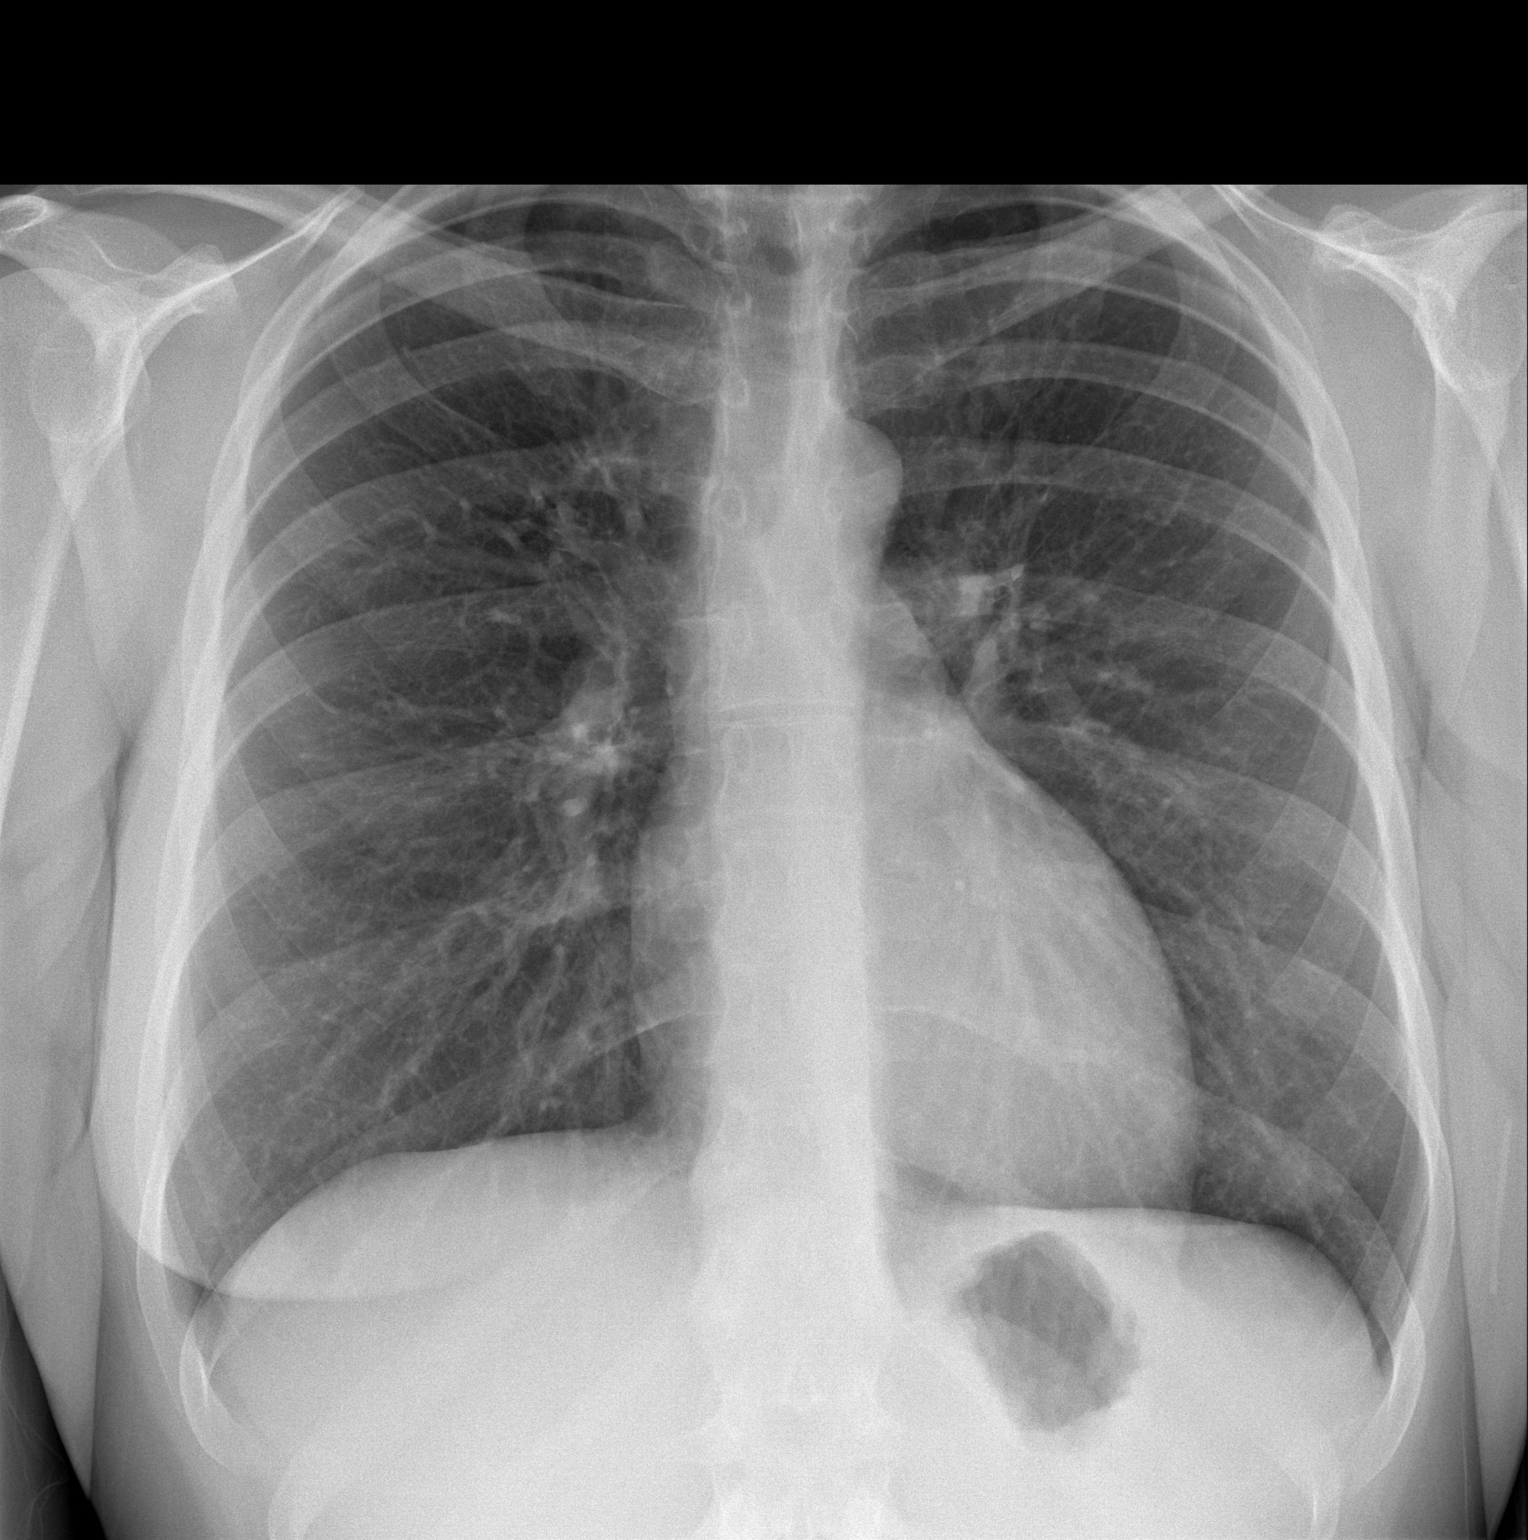
[im 2/2]
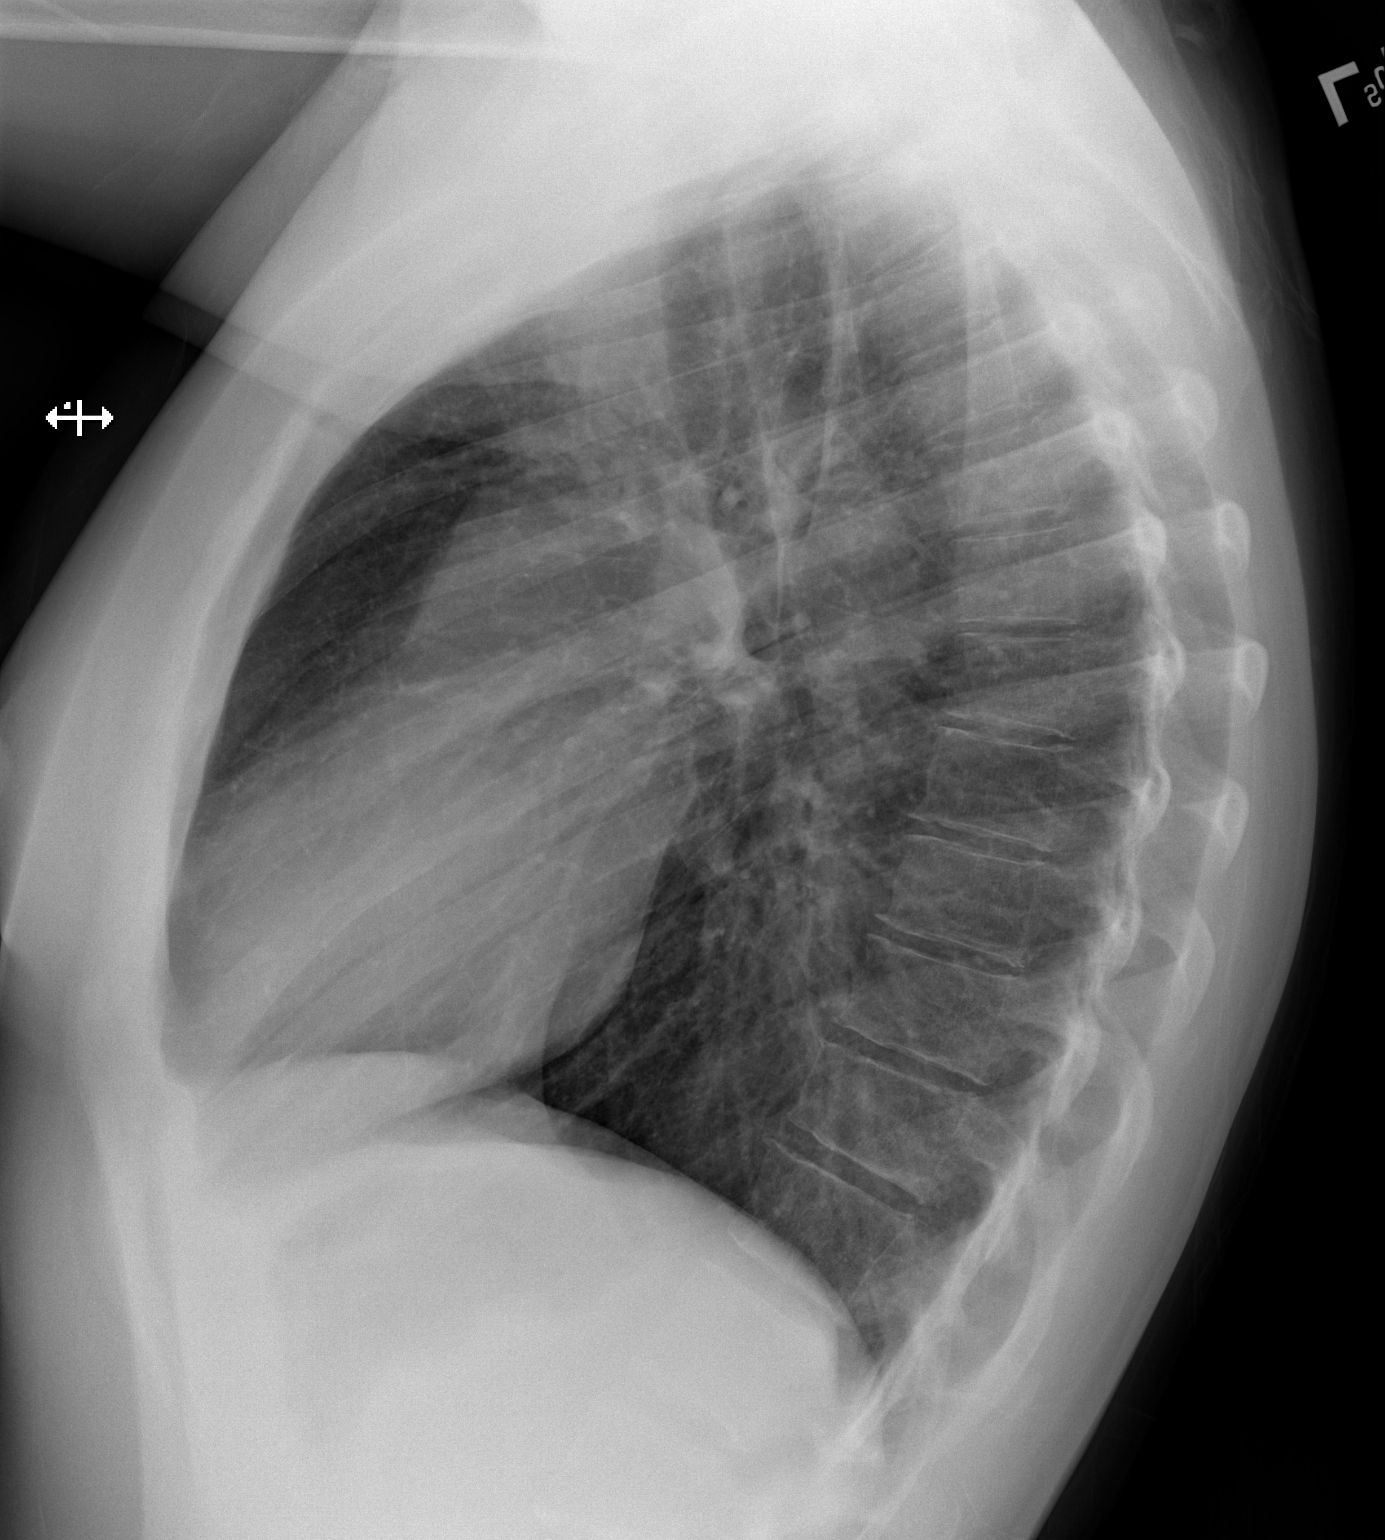

[2 of 2 positions shown; findings below may reference images not displayed]

FINDINGS: Normal heart size, mediastinal contours, and pulmonary vascularity.

Lungs well expanded and clear.

No pleural effusion or pneumothorax.

Bones unremarkable.
IMPRESSION: Normal exam.

## 2018-02-22 ENCOUNTER — Emergency Department
Admission: EM | Admit: 2018-02-22 | Discharge: 2018-02-22 | Disposition: A | Discharge status: Home / Self Care | Payer: Medicaid Other | Attending: Emergency Medicine | Admitting: Emergency Medicine

## 2018-02-22 ENCOUNTER — Emergency Department: Payer: Medicaid Other

## 2018-02-22 ENCOUNTER — Other Ambulatory Visit: Payer: Self-pay

## 2018-02-22 DIAGNOSIS — R079 Chest pain, unspecified: Secondary | ICD-10-CM

## 2018-02-22 DIAGNOSIS — T675XXA Heat exhaustion, unspecified, initial encounter: Secondary | ICD-10-CM | POA: Diagnosis not present

## 2018-02-22 DIAGNOSIS — N3090 Cystitis, unspecified without hematuria: Secondary | ICD-10-CM

## 2018-02-22 DIAGNOSIS — Z87891 Personal history of nicotine dependence: Secondary | ICD-10-CM | POA: Diagnosis not present

## 2018-02-22 DIAGNOSIS — J45909 Unspecified asthma, uncomplicated: Secondary | ICD-10-CM | POA: Diagnosis not present

## 2018-02-22 DIAGNOSIS — N3000 Acute cystitis without hematuria: Secondary | ICD-10-CM | POA: Diagnosis not present

## 2018-02-22 DIAGNOSIS — Z79899 Other long term (current) drug therapy: Secondary | ICD-10-CM | POA: Diagnosis not present

## 2018-02-22 HISTORY — DX: Cardiomyopathy, unspecified: I42.9

## 2018-02-22 LAB — COMPREHENSIVE METABOLIC PANEL
ALBUMIN: 4.8 g/dL (ref 3.5–5.0)
ALK PHOS: 55 U/L (ref 38–126)
ALT: 11 U/L (ref 0–44)
AST: 15 U/L (ref 15–41)
Anion gap: 9 (ref 5–15)
BILIRUBIN TOTAL: 1.3 mg/dL — AB (ref 0.3–1.2)
BUN: 11 mg/dL (ref 6–20)
CALCIUM: 9.1 mg/dL (ref 8.9–10.3)
CO2: 25 mmol/L (ref 22–32)
CREATININE: 0.66 mg/dL (ref 0.44–1.00)
Chloride: 107 mmol/L (ref 98–111)
GFR calc non Af Amer: >60 mL/min (ref >60)
GLUCOSE: 97 mg/dL (ref 70–99)
Potassium: 3.7 mmol/L (ref 3.5–5.1)
SODIUM: 141 mmol/L (ref 135–145)
TOTAL PROTEIN: 7.3 g/dL (ref 6.5–8.1)

## 2018-02-22 LAB — URINALYSIS, COMPLETE (UACMP) WITH MICROSCOPIC
Bacteria, UA: NONE SEEN
Bilirubin Urine: NEGATIVE
Glucose, UA: NEGATIVE mg/dL
Ketones, ur: NEGATIVE mg/dL
Nitrite: NEGATIVE
Protein, ur: NEGATIVE mg/dL
Specific Gravity, Urine: 1.016 (ref 1.005–1.030)
pH: 5 (ref 5.0–8.0)

## 2018-02-22 LAB — TROPONIN I: Troponin I: <0.03 ng/mL (ref <0.03)

## 2018-02-22 LAB — LIPASE, BLOOD: Lipase: 34 U/L (ref 11–51)

## 2018-02-22 LAB — POC URINE PREG, ED: Preg Test, Ur: NEGATIVE

## 2018-02-22 LAB — ACETAMINOPHEN LEVEL: Acetaminophen (Tylenol), Serum: <10 ug/mL — ABNORMAL LOW (ref 10–30)

## 2018-02-22 LAB — SALICYLATE LEVEL: Salicylate Lvl: <7 mg/dL (ref 2.8–30.0)

## 2018-02-22 MED ORDER — ONDANSETRON 4 MG PO TBDP
8.0000 mg | ORAL_TABLET | ORAL | Status: AC
  Administered 2018-02-22: 8 mg via ORAL

## 2018-02-22 MED ORDER — ONDANSETRON 4 MG PO TBDP: 4.0000 mg | ORAL_TABLET | Freq: Three times a day (TID) | ORAL | 0 refills | Status: DC | PRN

## 2018-02-22 MED ORDER — ONDANSETRON 4 MG PO TBDP
ORAL_TABLET | ORAL | Status: AC
  Administered 2018-02-22: 8 mg via ORAL
  Filled 2018-02-22: qty 2

## 2018-02-22 MED ORDER — IOPAMIDOL (ISOVUE-370) INJECTION 76%
75.0000 mL | Freq: Once | INTRAVENOUS | Status: AC | PRN
  Administered 2018-02-22: 75 mL via INTRAVENOUS

## 2018-02-22 MED ORDER — NITROFURANTOIN MACROCRYSTAL 100 MG PO CAPS: 100.0000 mg | ORAL_CAPSULE | Freq: Two times a day (BID) | ORAL | 0 refills | Status: DC

## 2018-02-22 MED ORDER — SODIUM CHLORIDE 0.9 % IV BOLUS
1000.0000 mL | Freq: Once | INTRAVENOUS | Status: AC
Start: 2018-02-22 — End: 2018-02-22
  Administered 2018-02-22: 1000 mL via INTRAVENOUS

## 2018-02-22 NOTE — ED Notes (Signed)
MD in with pt at this time.

## 2018-02-22 NOTE — ED Notes (Signed)
Pt resting. Respirations even and unlabored. NAD. Stretcher in low and locked position. Call bell in reach. Denies needs at this time. Family at bedside. Will continue to monitor. 

## 2018-02-22 NOTE — ED Provider Notes (Signed)
[facility] Emergency Department Provider Note  ____________________________________________  Time seen: Approximately 12:45 PM  I have reviewed the triage vital signs and the nursing notes.   HISTORY  Chief Complaint Chest Pain    HPI Kayla Garrison is a 32 y.o. female with a history of anxiety, bipolar disorder, cardiomyopathy, and renal artery aneurysm who comes the ED complaining of  central chest tightness that started yesterday afternoon at work.  Associated with nausea and vomiting.  Nonradiating, no shortness of breath.  No aggravating or alleviating factors.  Constant since yesterday, waxing and waning.  [place] in [employer] [place].  [facility] not have [facility] and keeps the dining room area at 70 degrees but allows the kitchen to remain very hot.  She also notes that the hood vents do not seem to be functioning and so with the fryers, grill, and stove, it is persistently very hot in that environment.  She does also have a Nexplanon contraceptive.   Past Medical History:  Diagnosis Date  . Acid reflux   . Anxiety   . Asthma    As child  . Bipolar 1 disorder (HCC)   . Cardiomyopathy (HCC)   . Concussion   . Migraines   . Renal arterial aneurysm (HCC)   . Sternal fracture      There are no active problems to display for this patient.    Past Surgical History:  Procedure Laterality Date  . ESOPHAGOGASTRODUODENOSCOPY (EGD) WITH PROPOFOL N/A 09/27/2015   Procedure: ESOPHAGOGASTRODUODENOSCOPY (EGD) WITH PROPOFOL;  Surgeon: Elnita MaxwellMatthew Gordon Rein, MD;  [facility] END[facility]COPY;  Service: Endoscopy;  Laterality: N/A;  . ESOPHAGOGASTRODUODENOSCOPY ENDOSCOPY    . NO PAST SURGERIES       Prior to Admission medications   Medication Sig Start Date End Date Taking? Authorizing Provider  carvedilol (COREG) 3.125 MG tablet Take 3.125 mg by mouth 2 (two) times daily. 12/29/17  Yes [provider]   HYDROcodone-acetaminophen (NORCO/VICODIN) 5-325 MG tablet Take 1 tablet by mouth every 6 (six) hours as needed for pain. 02/17/18  Yes [provider]  LORazepam (ATIVAN) 1 MG tablet Take 1 mg by mouth every 8 (eight) hours as needed for anxiety. 01/16/18  Yes [provider]  norethindrone (AYGESTIN) 5 MG tablet Take 2.5 mg by mouth daily. 02/06/18  Yes [provider]  cyclobenzaprine (FLEXERIL) 5 MG tablet Take 1 tablet (5 mg total) by mouth every 8 (eight) hours as needed for muscle spasms. Patient not taking: Reported on 09/27/2015 03/13/15   Menshew, Charlesetta IvoryJenise V Bacon, PA-C  nitrofurantoin (MACRODANTIN) 100 MG capsule Take 1 capsule (100 mg total) by mouth 2 (two) times daily. 02/22/18   Sharman CheekStafford, Sheriann Newmann, MD  ondansetron (ZOFRAN ODT) 4 MG disintegrating tablet Take 1 tablet (4 mg total) by mouth every 8 (eight) hours as needed for nausea or vomiting. 02/22/18   Sharman CheekStafford, Melynda Krzywicki, MD     Allergies Eggs or egg-derived products and Ibuprofen   No family history on file.  Social History Social History   Tobacco Use  . Smoking status: Former Games developermoker  . Smokeless tobacco: Never Used  Substance Use Topics  . Alcohol use: Yes  . Drug use: No    Review of Systems  Constitutional:   No fever or chills.  ENT:   No sore throat. No rhinorrhea. Cardiovascular: Positive as above chest pain without syncope. Respiratory:   No dyspnea or cough. Gastrointestinal:   Negative for  abdominal pain, vomiting and diarrhea.  Musculoskeletal:   Negative for focal pain or swelling All other systems reviewed and are negative except as documented above in ROS and HPI.  ____________________________________________   PHYSICAL EXAM:  VITAL SIGNS: ED Triage Vitals  Enc Vitals Group     BP 02/22/18 0928 129/81     Pulse Rate 02/22/18 0928 69     Resp 02/22/18 0928 18     Temp 02/22/18 0928 97.6 F (36.4 C)     Temp Source 02/22/18 0928 Oral     SpO2 02/22/18 0928 100 %      Weight 02/22/18 0931 140 lb (63.5 kg)     Height 02/22/18 0931 5\' 7"  (1.702 m)     Head Circumference --      Peak Flow --      Pain Score 02/22/18 0931 4     Pain Loc --      Pain Edu? --      Excl. in GC? --     Vital signs reviewed, nursing assessments reviewed.   Constitutional:   Alert and oriented. Non-toxic appearance. Eyes:   Conjunctivae are normal. EOMI. PERRL. ENT      Head:   Normocephalic and atraumatic.      Nose:   No congestion/rhinnorhea.       Mouth/Throat:   MMM, no pharyngeal erythema. No peritonsillar mass.       Neck:   No meningismus. Full ROM. Hematological/Lymphatic/Immunilogical:   No cervical lymphadenopathy. Cardiovascular:   RRR. Symmetric bilateral radial and DP pulses.  No murmurs. Cap refill less than 2 seconds. Respiratory:   Normal respiratory effort without tachypnea/retractions. Breath sounds are clear and equal bilaterally. No wheezes/rales/rhonchi. Gastrointestinal:   Soft and nontender. Non distended. There is no CVA tenderness.  No rebound, rigidity, or guarding. Musculoskeletal:   Normal range of motion in all extremities. No joint effusions.  No lower extremity tenderness.  No edema. Neurologic:   Normal speech and language.  Motor grossly intact. No acute focal neurologic deficits are appreciated.  Skin:    Skin is warm, dry and intact. No rash noted.  No petechiae, purpura, or bullae.  ____________________________________________    LABS (pertinent positives/negatives) (all labs ordered are listed, but only abnormal results are displayed) Labs Reviewed  COMPREHENSIVE METABOLIC PANEL - Abnormal; Notable for the following components:      Result Value   Total Bilirubin 1.3 (*)    All other components within normal limits  ACETAMINOPHEN LEVEL - Abnormal; Notable for the following components:   Acetaminophen (Tylenol), Serum <10 (*)    All other components within normal limits  URINALYSIS, COMPLETE (UACMP) WITH MICROSCOPIC -  Abnormal; Notable for the following components:   Color, Urine YELLOW (*)    APPearance CLOUDY (*)    Hgb urine dipstick LARGE (*)    Leukocytes, UA MODERATE (*)    All other components within normal limits  POC URINE PREG, ED - Normal  LIPASE, BLOOD  TROPONIN I  SALICYLATE LEVEL   ____________________________________________   EKG  Interpreted by me Sinus rhythm rate of 72, right axis, normal intervals, normal QRS ST segments and T waves.  ____________________________________________    RADIOLOGY  Ct Angio Chest Pe W And/or Wo Contrast  Result Date: 02/22/2018 CLINICAL DATA:  PE suspected, high pretest probability. Upper abdominal pain. Left-sided chest pain. Pain began at work yesterday. EXAM: CT ANGIOGRAPHY CHEST CT ABDOMEN AND PELVIS WITH CONTRAST TECHNIQUE: Multidetector CT imaging of the chest was performed  using the standard protocol during bolus administration of intravenous contrast. Multiplanar CT image reconstructions and MIPs were obtained to evaluate the vascular anatomy. Multidetector CT imaging of the abdomen and pelvis was performed using the standard protocol during bolus administration of intravenous contrast. CONTRAST:  75mL ISOVUE-370 IOPAMIDOL (ISOVUE-370) INJECTION 76% COMPARISON:  Two-view chest x-ray 02/23/2016. FINDINGS: CTA CHEST FINDINGS Cardiovascular: Heart size is normal. No significant pericardial effusion is present. Aorta and great vessel origins are within normal limits. Pulmonary artery opacification is excellent. No focal filling defects are present to suggest pulmonary emboli. Mediastinum/Nodes: No significant mediastinal hilar, or axillary adenopathy is present. Esophagus is within normal limits. Lungs/Pleura: No focal nodule, mass, or airspace disease is present. No significant pleural effusion. Musculoskeletal: Chest wall is within normal limits. Vertebral body heights and alignment are maintained. Ribs are unremarkable. Review of the MIP images  confirms the above findings. CT ABDOMEN and PELVIS FINDINGS Hepatobiliary: No focal liver abnormality is seen. No gallstones, gallbladder wall thickening, or biliary dilatation. Pancreas: Unremarkable. No pancreatic ductal dilatation or surrounding inflammatory changes. Spleen: Normal in size without focal abnormality. Adrenals/Urinary Tract: Adrenal glands are normal bilaterally. A simple cyst at the lower pole of the right kidney measures 10 mm. A 4 mm nonobstructing stone is present in the midportion of the left kidney. Stomach/Bowel: The stomach and duodenum are within normal limits. Small bowel is unremarkable. Appendix is not discretely visualized. No secondary inflammatory scratched at the appendix is visualized and normal. The ascending and transverse colon are within normal limits. Descending and sigmoid colon are normal. Vascular/Lymphatic: No significant vascular findings are present. No enlarged abdominal or pelvic lymph nodes. Reproductive: Uterus is mildly edematous without focal lesion. Dominant right follicle measures 2.5 cm. Left adnexa is unremarkable. Other: No abdominal wall hernia or abnormality. No abdominopelvic ascites. Musculoskeletal: Vertebral body heights are maintained. Pelvis is within normal limits. Hips are located and normal. Review of the MIP images confirms the above findings. IMPRESSION: 1. No pulmonary embolus. 2. No acute or focal lesion to explain chest pain. 3. 4 mm nonobstructing stone in the left kidney. 4. Simple cyst at the lower pole of the right kidney. 5. Uterus is mildly edematous, likely physiologic. 6. No acute or focal lesion to explain pain otherwise. Electronically Signed   By: Marin Roberts M.D.   On: 02/22/2018 11:45   Ct Abdomen Pelvis W Contrast  Result Date: 02/22/2018 CLINICAL DATA:  PE suspected, high pretest probability. Upper abdominal pain. Left-sided chest pain. Pain began at work yesterday. EXAM: CT ANGIOGRAPHY CHEST CT ABDOMEN AND PELVIS  WITH CONTRAST TECHNIQUE: Multidetector CT imaging of the chest was performed using the standard protocol during bolus administration of intravenous contrast. Multiplanar CT image reconstructions and MIPs were obtained to evaluate the vascular anatomy. Multidetector CT imaging of the abdomen and pelvis was performed using the standard protocol during bolus administration of intravenous contrast. CONTRAST:  75mL ISOVUE-370 IOPAMIDOL (ISOVUE-370) INJECTION 76% COMPARISON:  Two-view chest x-ray 02/23/2016. FINDINGS: CTA CHEST FINDINGS Cardiovascular: Heart size is normal. No significant pericardial effusion is present. Aorta and great vessel origins are within normal limits. Pulmonary artery opacification is excellent. No focal filling defects are present to suggest pulmonary emboli. Mediastinum/Nodes: No significant mediastinal hilar, or axillary adenopathy is present. Esophagus is within normal limits. Lungs/Pleura: No focal nodule, mass, or airspace disease is present. No significant pleural effusion. Musculoskeletal: Chest wall is within normal limits. Vertebral body heights and alignment are maintained. Ribs are unremarkable. Review of the MIP images confirms the  above findings. CT ABDOMEN and PELVIS FINDINGS Hepatobiliary: No focal liver abnormality is seen. No gallstones, gallbladder wall thickening, or biliary dilatation. Pancreas: Unremarkable. No pancreatic ductal dilatation or surrounding inflammatory changes. Spleen: Normal in size without focal abnormality. Adrenals/Urinary Tract: Adrenal glands are normal bilaterally. A simple cyst at the lower pole of the right kidney measures 10 mm. A 4 mm nonobstructing stone is present in the midportion of the left kidney. Stomach/Bowel: The stomach and duodenum are within normal limits. Small bowel is unremarkable. Appendix is not discretely visualized. No secondary inflammatory scratched at the appendix is visualized and normal. The ascending and transverse colon are  within normal limits. Descending and sigmoid colon are normal. Vascular/Lymphatic: No significant vascular findings are present. No enlarged abdominal or pelvic lymph nodes. Reproductive: Uterus is mildly edematous without focal lesion. Dominant right follicle measures 2.5 cm. Left adnexa is unremarkable. Other: No abdominal wall hernia or abnormality. No abdominopelvic ascites. Musculoskeletal: Vertebral body heights are maintained. Pelvis is within normal limits. Hips are located and normal. Review of the MIP images confirms the above findings. IMPRESSION: 1. No pulmonary embolus. 2. No acute or focal lesion to explain chest pain. 3. 4 mm nonobstructing stone in the left kidney. 4. Simple cyst at the lower pole of the right kidney. 5. Uterus is mildly edematous, likely physiologic. 6. No acute or focal lesion to explain pain otherwise. Electronically Signed   By: Marin Robertshristopher  Mattern M.D.   On: 02/22/2018 11:45    ____________________________________________   PROCEDURES Procedures  ____________________________________________  DIFFERENTIAL DIAGNOSIS   Pulmonary embolism, aortic dissection, dehydration/heat related illness, pancreatitis, biliary disease  CLINICAL IMPRESSION / ASSESSMENT AND PLAN / ED COURSE  Pertinent labs & imaging results that were available during my care of the patient were reviewed by me and considered in my medical decision making (see chart for details).    Patient presents with atypical chest pain.  In the setting of her contraceptive use, past medical history, there is a significant possibility of a serious vascular event such as PE or dissection.  CT scans were obtained to evaluate along with labs.  Work-up entirely negative and reassuring except for UA consistent with UTI.  Also the patient on Macrobid, Zofran as needed, encourage hydration and rest and follow-up with primary care.      ____________________________________________   FINAL CLINICAL  IMPRESSION(S) / ED DIAGNOSES    Final diagnoses:  Nonspecific chest pain  Cystitis  Heat exhaustion, initial encounter     ED Discharge Orders         Ordered    ondansetron (ZOFRAN ODT) 4 MG disintegrating tablet  Every 8 hours PRN     02/22/18 1243    nitrofurantoin (MACRODANTIN) 100 MG capsule  2 times daily     02/22/18 1243          Portions of this note were generated with dragon dictation software. Dictation errors may occur despite best attempts at proofreading.    Sharman CheekStafford, Tasnim Balentine, MD 02/22/18 1249

## 2018-02-22 NOTE — Discharge Instructions (Addendum)
Your lab test today show a urinary tract infection but are otherwise okay.  Your CT scans of the chest, abdomen, and pelvis were also unremarkable.  Take Macrobid for the UTI, stay hydrated and follow-up with your doctor.

## 2018-02-22 NOTE — ED Triage Notes (Signed)
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 States she is still having upper abd pain and left side of chest. States she has a hx of cardiomyopathy.

## 2018-02-22 NOTE — ED Notes (Signed)
Pt resting. Respirations even and unlabored. NAD. Stretcher in low and locked position. Call bell in reach. Pt asking how much longer wait will be. Informed her that I will talk to MD. Will continue to monitor.

## 2018-02-22 NOTE — ED Notes (Signed)
Pt at CT at this time.

## 2018-08-12 ENCOUNTER — Encounter (INDEPENDENT_AMBULATORY_CARE_PROVIDER_SITE_OTHER): Payer: Self-pay | Admitting: Vascular Surgery

## 2018-08-12 ENCOUNTER — Ambulatory Visit (INDEPENDENT_AMBULATORY_CARE_PROVIDER_SITE_OTHER): Payer: Medicaid Other | Admitting: Vascular Surgery

## 2018-08-12 DIAGNOSIS — Z87891 Personal history of nicotine dependence: Secondary | ICD-10-CM | POA: Diagnosis not present

## 2018-08-12 DIAGNOSIS — I83813 Varicose veins of bilateral lower extremities with pain: Secondary | ICD-10-CM

## 2018-08-12 NOTE — Progress Notes (Signed)
Patient ID: Kayla Garrison, female   DOB: October 10, 1985, 32 y.o.   MRN: [id number]  Chief Complaint  Patient presents with  . Follow-up    HPI Kayla Garrison is a 33 y.o. female.    The patient presents with complaints of symptomatic varicosities of the lower extremities.  She is an established patient and has last been seen a little over 2 years ago.  She has continued conservative measures since that time, but continues to be symptomatic.  The patient reports a long standing history of varicosities and they have become painful over time.  We have previously performed laser ablation on the right leg with good results.  She has had some recurrent varicosities on the right but not as bad as the left.  There was no clear inciting event or causative factor that started the symptoms.  The left leg is more severly affected. The patient elevates the legs for relief. The pain is described as stinging and burning overlying the varicosities as well as heaviness and tiredness in the legs. The symptoms are generally most severe in the evening, particularly when they have been on their feet for long periods of time.  Compression stockings has been used to try to improve the symptoms with limited success. The patient complains of frequent swelling as an associated symptom especially after work. The patient has no previous history of deep venous thrombosis or superficial thrombophlebitis to their knowledge.     Past Medical History:  Diagnosis Date  . Acid reflux   . Anxiety   . Asthma    As child  . Bipolar 1 disorder (HCC)   . Cardiomyopathy (HCC)   . Concussion   . Migraines   . Renal arterial aneurysm (HCC)   . Sternal fracture     Past Surgical History:  Procedure Laterality Date  . ESOPHAGOGASTRODUODENOSCOPY (EGD) WITH PROPOFOL N/A 09/27/2015   Procedure: ESOPHAGOGASTRODUODENOSCOPY (EGD) WITH PROPOFOL;  Surgeon: Elnita Maxwell, MD;  Location: [facility] ENDOSCOPY;  Service: Endoscopy;   Laterality: N/A;  . ESOPHAGOGASTRODUODENOSCOPY ENDOSCOPY    . NO PAST SURGERIES      Family History No bleeding or clotting disorders  Social History Social History   Tobacco Use  . Smoking status: Former Games developer  . Smokeless tobacco: Never Used  Substance Use Topics  . Alcohol use: Yes  . Drug use: No     Allergies  Allergen Reactions  . Eggs Or Egg-Derived Products Hives  . Ibuprofen     Stomach complaints    Current Outpatient Medications  Medication Sig Dispense Refill  . carvedilol (COREG) 3.125 MG tablet Take 3.125 mg by mouth 2 (two) times daily.  10  . clonazePAM (KLONOPIN) 0.5 MG tablet Take by mouth daily.    Marland Kitchen etonogestrel (NEXPLANON) 68 MG IMPL implant 1 each by Subdermal route once.     No current facility-administered medications for this visit.       REVIEW OF SYSTEMS (Negative unless checked)  Constitutional: [] Weight loss  [] Fever  [] Chills Cardiac: [] Chest pain   [] Chest pressure   [] Palpitations   [] Shortness of breath when laying flat   [] Shortness of breath at rest   [] Shortness of breath with exertion. Vascular:  [] Pain in legs with walking   [] Pain in legs at rest   [] Pain in legs when laying flat   [] Claudication   [] Pain in feet when walking  [] Pain in feet at rest  [] Pain in feet when laying flat   [] History of DVT   []   Phlebitis   [x] Swelling in legs   [x] Varicose veins   [] Non-healing ulcers Pulmonary:   [] Uses home oxygen   [] Productive cough   [] Hemoptysis   [] Wheeze  [] COPD   [] Asthma Neurologic:  [] Dizziness  [] Blackouts   [] Seizures   [] History of stroke   [] History of TIA  [] Aphasia   [] Temporary blindness   [] Dysphagia   [] Weakness or numbness in arms   [] Weakness or numbness in legs Musculoskeletal:  [] Arthritis   [] Joint swelling   [] Joint pain   [] Low back pain Hematologic:  [] Easy bruising  [] Easy bleeding   [] Hypercoagulable state   [] Anemic  [] Hepatitis Gastrointestinal:  [] Blood in stool   [] Vomiting blood  [x] Gastroesophageal  reflux/heartburn   [] Abdominal pain Genitourinary:  [] Chronic kidney disease   [] Difficult urination  [] Frequent urination  [] Burning with urination   [] Hematuria Skin:  [] Rashes   [] Ulcers   [] Wounds Psychological:  [x] History of anxiety   []  History of major depression.    Physical Exam BP 127/83 (BP Location: Right Arm, Patient Position: Sitting)   Pulse 80   Resp 12   Ht 5\' 6"  (1.676 m)   Wt 164 lb (74.4 kg)   BMI 26.47 kg/m  Gen:  WD/WN, NAD Head: [place]/AT, No temporalis wasting.  Ear/Nose/Throat: Hearing grossly intact, dentition good Eyes: Sclera non-icteric. Conjunctiva clear Neck: Supple. Trachea midline Pulmonary:  Good air movement, no use of accessory muscles, respirations not labored.  Cardiac: RRR, No JVD Vascular: Varicosities scattered and measuring up to 1-2 mm in the right lower extremity        Varicosities diffuse and measuring up to 3-4 mm in the left lower extremity Vessel Right Left  Radial Palpable Palpable                          PT Palpable Palpable  DP Palpable Palpable    Musculoskeletal: M/S 5/5 throughout.   No RLE edema.  Trace LLE edema Neurologic: Sensation grossly intact in extremities.  Symmetrical.  Speech is fluent.  Psychiatric: Judgment intact, Mood & affect appropriate for pt's clinical situation. Dermatologic: No rashes or ulcers noted.  No cellulitis or open wounds.    Radiology No results found.  Labs No results found for this or any previous visit (from the past 2160 hour(s)).  Assessment/Plan:  Varicose veins of leg with pain, bilateral   The patient has symptoms consistent with chronic venous insufficiency. We discussed the natural history and treatment options for venous disease. I recommended the regular use of 20 - 30 mm Hg compression stockings, and prescribed these again today. She has these and has been wearing them but with weight loss they are not as tight as they once were. I recommended leg elevation and  anti-inflammatories as needed for pain. I have also recommended a complete venous duplex to assess the venous system for reflux or thrombotic issues. This can be done at the patient's convenience. I will see the patient back after the duplex to assess the response to conservative management, and determine further treatment options.     Festus Barren 08/12/2018, 1:52 PM   This note was created with Dragon medical transcription system.  Any errors from dictation are unintentional.

## 2018-08-22 ENCOUNTER — Ambulatory Visit (INDEPENDENT_AMBULATORY_CARE_PROVIDER_SITE_OTHER): Payer: Medicaid Other | Admitting: Vascular Surgery

## 2018-08-22 ENCOUNTER — Encounter (INDEPENDENT_AMBULATORY_CARE_PROVIDER_SITE_OTHER): Payer: Self-pay | Admitting: Vascular Surgery

## 2018-08-22 ENCOUNTER — Ambulatory Visit (INDEPENDENT_AMBULATORY_CARE_PROVIDER_SITE_OTHER): Payer: Medicaid Other

## 2018-08-22 VITALS — BP 131/87 | HR 88 | Resp 12 | Ht 67.0 in | Wt 171.2 lb

## 2018-08-22 DIAGNOSIS — I83813 Varicose veins of bilateral lower extremities with pain: Secondary | ICD-10-CM

## 2018-08-22 DIAGNOSIS — I890 Lymphedema, not elsewhere classified: Secondary | ICD-10-CM | POA: Insufficient documentation

## 2018-08-22 DIAGNOSIS — I872 Venous insufficiency (chronic) (peripheral): Secondary | ICD-10-CM | POA: Diagnosis not present

## 2018-08-22 NOTE — Progress Notes (Signed)
Subjective:    Patient ID: Kayla Garrison, female    DOB: 1986/03/13, 33 y.o.   MRN: [id number] Chief Complaint  Patient presents with  . Follow-up    Patient presents to review vascular studies.  Patient was last seen on August 12, 2018 and evaluation of progressively worsening varicosities located to the bilateral legs.  The patient was initially seen approximately 2 years ago.  The patient does have a history of right great saphenous vein stripping.  Since her initial visit, the patient has been engaging conservative therapy including wearing medical grade 1 compression socks, elevating her legs and remaining active.  The patient notes that the varicose veins located to the bilateral legs are progressively more painful.  The patient feels that her discomfort has progressed to the point that she is unable to function on daily basis and they have become lifestyle limiting.  The patient underwent a bilateral lower extremity venous reflux study which was notable for: Right: Reflux noted in the common femoral vein, saphenofemoral junction, and accessory saphenous vein.  Great saphenous vein is not visualized. Left: Reflux noted in the common femoral vein and saphenofemoral junction.  Patient denies any claudication-like symptoms, rest pain or ulcer formation to the bilateral legs.  The patient has noticed an increase in edema to the bilateral legs.  This edema is also quite painful.  Patient denies any recurrent or recent bouts of cellulitis.  Patient denies any fever, nausea vomiting.  Review of Systems  Constitutional: Negative.   HENT: Negative.   Eyes: Negative.   Respiratory: Negative.   Cardiovascular: Positive for leg swelling.       Painful varicose veins  Gastrointestinal: Negative.   Endocrine: Negative.   Genitourinary: Negative.   Musculoskeletal: Negative.   Skin: Negative.   Allergic/Immunologic: Negative.   Neurological: Negative.   Hematological: Negative.     Psychiatric/Behavioral: Negative.       Objective:   Physical Exam Vitals signs reviewed.  Constitutional:      Appearance: Normal appearance.  HENT:     Head: Normocephalic and atraumatic.     Right Ear: External ear normal.     Left Ear: External ear normal.     Nose: Nose normal.     Mouth/Throat:     Mouth: Mucous membranes are dry.     Pharynx: Oropharynx is clear.  Eyes:     Extraocular Movements: Extraocular movements intact.     Conjunctiva/sclera: Conjunctivae normal.     Pupils: Pupils are equal, round, and reactive to light.  Neck:     Musculoskeletal: Normal range of motion.  Cardiovascular:     Rate and Rhythm: Normal rate and regular rhythm.     Pulses: Normal pulses.  Pulmonary:     Effort: Pulmonary effort is normal.     Breath sounds: Normal breath sounds.  Musculoskeletal: Normal range of motion.        General: Swelling (Mild nonpitting edema noted bilaterally) present.  Skin:    General: Skin is warm and dry.     Comments: Greater than 1 cm varicose veins noted to the bilateral extremities  Neurological:     General: No focal deficit present.     Mental Status: She is alert and oriented to person, place, and time.  Psychiatric:        Mood and Affect: Mood normal.        Behavior: Behavior normal.        Thought Content: Thought content normal.  Judgment: Judgment normal.    BP 131/87 (BP Location: Right Arm, Patient Position: Sitting)   Pulse 88   Resp 12   Ht 5\' 7"  (1.702 m)   Wt 171 lb 3.2 oz (77.7 kg)   BMI 26.81 kg/m    Past Medical History:  Diagnosis Date  . Acid reflux   . Anxiety   . Asthma    As child  . Bipolar 1 disorder (HCC)   . Cardiomyopathy (HCC)   . Concussion   . Migraines   . Renal arterial aneurysm (HCC)   . Sternal fracture    Social History   Socioeconomic History  . Marital status: Married    Spouse name: Not on file  . Number of children: Not on file  . Years of education: Not on file  .  Highest education level: Not on file  Occupational History  . Not on file  Social Needs  . Financial resource strain: Not on file  . Food insecurity:    Worry: Not on file    Inability: Not on file  . Transportation needs:    Medical: Not on file    Non-medical: Not on file  Tobacco Use  . Smoking status: Former Games developermoker  . Smokeless tobacco: Never Used  Substance and Sexual Activity  . Alcohol use: Yes  . Drug use: No  . Sexual activity: Not on file  Lifestyle  . Physical activity:    Days per week: Not on file    Minutes per session: Not on file  . Stress: Not on file  Relationships  . Social connections:    Talks on phone: Not on file    Gets together: Not on file    Attends religious service: Not on file    Active member of club or organization: Not on file    Attends meetings of clubs or organizations: Not on file    Relationship status: Not on file  . Intimate partner violence:    Fear of current or ex partner: Not on file    Emotionally abused: Not on file    Physically abused: Not on file    Forced sexual activity: Not on file  Other Topics Concern  . Not on file  Social History Narrative  . Not on file   Past Surgical History:  Procedure Laterality Date  . ESOPHAGOGASTRODUODENOSCOPY (EGD) WITH PROPOFOL N/A 09/27/2015   Procedure: ESOPHAGOGASTRODUODENOSCOPY (EGD) WITH PROPOFOL;  Surgeon: Elnita MaxwellMatthew Gordon Rein, MD;  [facility];  Service: Endoscopy;  Laterality: N/A;  . ESOPHAGOGASTRODUODENOSCOPY ENDOSCOPY    . NO PAST SURGERIES     No family history on file.  Allergies  Allergen Reactions  . Eggs Or Egg-Derived Products Hives  . Ibuprofen     Stomach complaints      Assessment & Plan:   Patient presents to review vascular studies.  Patient was last seen on August 12, 2018 and evaluation of progressively worsening varicosities located to the bilateral legs.  The patient was initially seen approximately 2 years ago.  The patient does have a  history of right great saphenous vein stripping.  Since her initial visit, the patient has been engaging conservative therapy including wearing medical grade 1 compression socks, elevating her legs and remaining active.  The patient notes that the varicose veins located to the bilateral legs are progressively more painful.  The patient feels that her discomfort has progressed to the point that she is unable to function on daily basis and they  have become lifestyle limiting.  The patient underwent a bilateral lower extremity venous reflux study which was notable for: Right: Reflux noted in the common femoral vein, saphenofemoral junction, and accessory saphenous vein.  Great saphenous vein is not visualized. Left: Reflux noted in the common femoral vein and saphenofemoral junction.  Patient denies any claudication-like symptoms, rest pain or ulcer formation to the bilateral legs.  The patient has noticed an increase in edema to the bilateral legs.  This edema is also quite painful.  Patient denies any recurrent or recent bouts of cellulitis.  Patient denies any fever, nausea vomiting.  1. Chronic venous insufficiency - Stable Patient with venous reflux noted to the bilateral common femoral veins. Due to the anatomic location of these veins the patient is not a candidate for endovenous laser ablation or sclerotherapy. The patient does have reflux noted in an accessory vein coming off of her previously stripped great saphenous vein to the right lower extremity.  The patient would benefit from foam sclerotherapy into this area. The patient would also benefit from foam followed by saline sclerotherapy to the more superficial varicose veins located to the bilateral legs.  These veins have progressively become more painful and they are now lifestyle limiting for the patient. I will applied to the patient's insurance In the meantime, the patient should continue engaging conservative therapy including wearing  medical grade 1 compression socks, elevating her legs and remaining active  2. Lymphedema - Stable Patient may be a candidate for lymphedema pump in the future if the foam/saline sclerotherapy into her legs does not provide relief.  3. Varicose veins of leg with pain, bilateral - Stable As above  Current Outpatient Medications on File Prior to Visit  Medication Sig Dispense Refill  . carvedilol (COREG) 3.125 MG tablet Take 3.125 mg by mouth 2 (two) times daily.  10  . clonazePAM (KLONOPIN) 0.5 MG tablet Take by mouth daily.    Marland Kitchen etonogestrel (NEXPLANON) 68 MG IMPL implant 1 each by Subdermal route once.     No current facility-administered medications on file prior to visit.    There are no Patient Instructions on file for this visit. No follow-ups on file.  KIMBERLY A STEGMAYER, PA-C

## 2018-11-19 IMAGING — CT CT ABD-PELV W/ CM
3 of 10 series · 11 of 46 positions shown, 17 images · IV contrast (APPLIED)
Comparison: Two-view chest x-ray 02/23/2016.

CLINICAL DATA: PE suspected, high pretest probability. Upper
abdominal pain. Left-sided chest pain. Pain began at work yesterday.

EXAM:
CT ANGIOGRAPHY CHEST
CT ABDOMEN AND PELVIS WITH CONTRAST
TECHNIQUE: Multidetector CT imaging of the chest was performed using the
standard protocol during bolus administration of intravenous
contrast. Multiplanar CT image reconstructions and MIPs were
obtained to evaluate the vascular anatomy. Multidetector CT imaging
of the abdomen and pelvis was performed using the standard protocol
during bolus administration of intravenous contrast.
CONTRAST:  75mL [id number] IOPAMIDOL ([id number]) INJECTION 76%

[Series 6: thins · axial · 0.61mm/px · z∈[-329,-191]mm · 5 of 294 slices shown]
[im 35/294  soft-tissue]
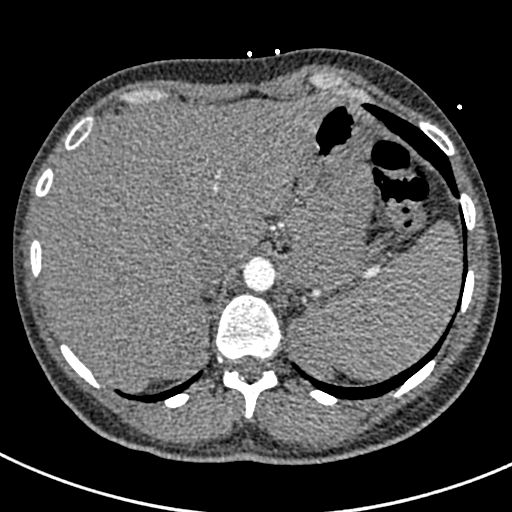
[im 69/294  soft-tissue]
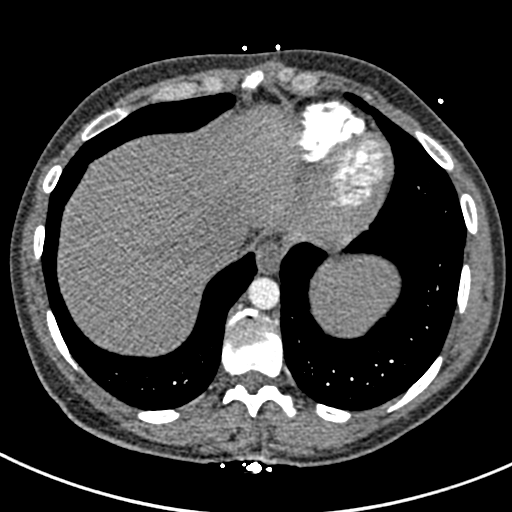
[im 104/294  soft-tissue]
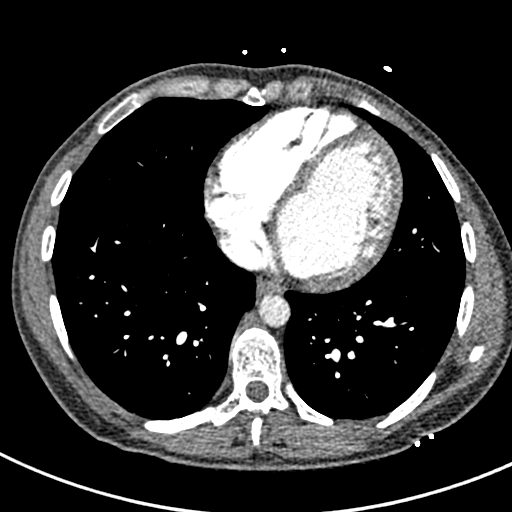
[im 138/294  soft-tissue]
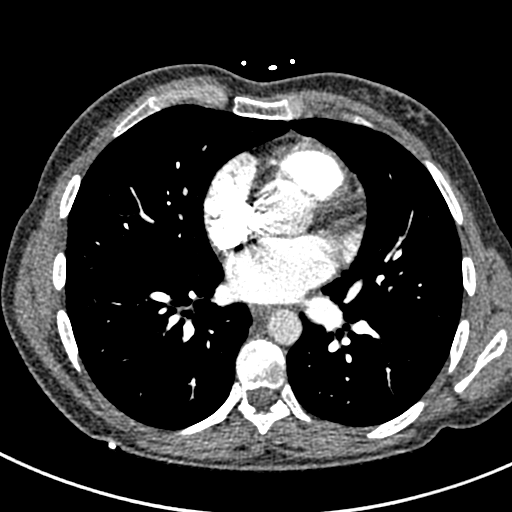
[im 173/294  soft-tissue]
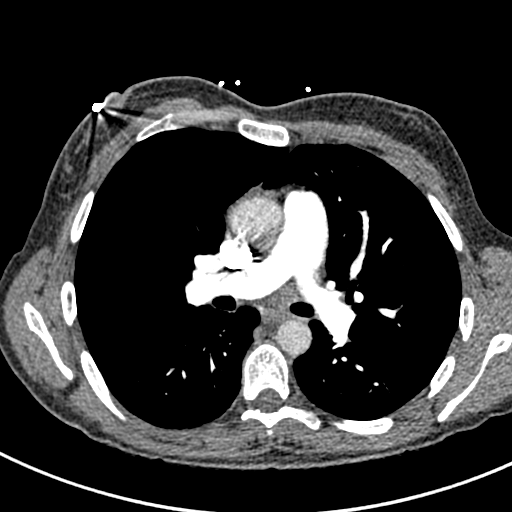

[Series 8: coronal mpr · coronal · 0.63mm/px · 2 of 132 slices shown, 3 images]
[im 44/132  soft-tissue]
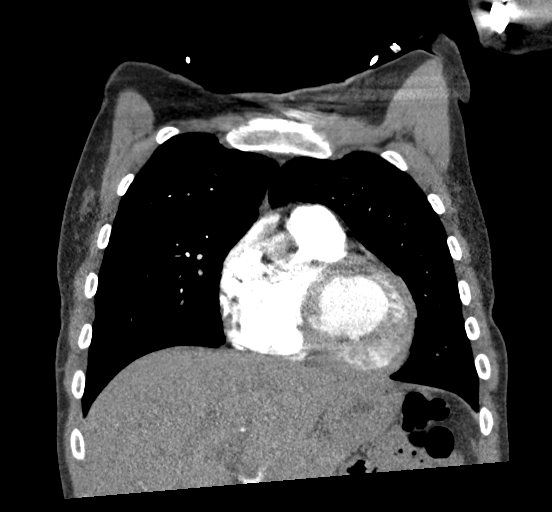
[im 44/132  bone]
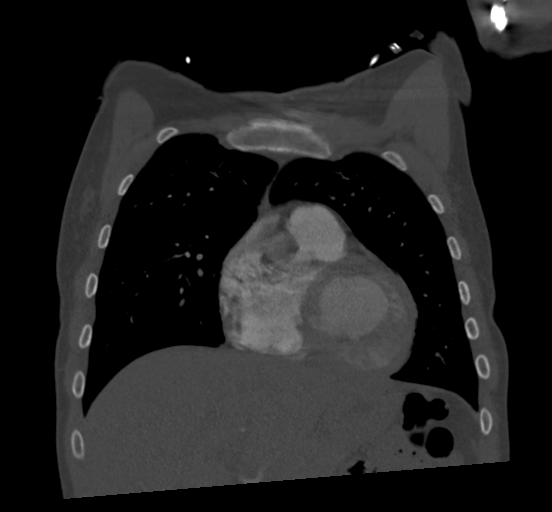
[im 88/132  soft-tissue]
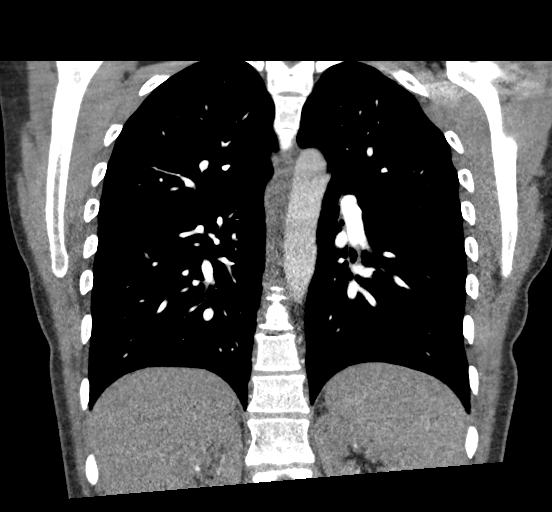

[Series 12: axial st · axial · 0.69mm/px · z∈[-650,-355]mm · 4 of 99 slices shown, 9 images]
[im 20/99  soft-tissue]
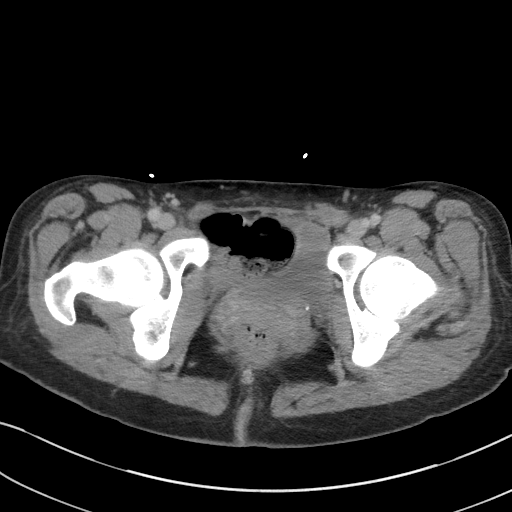
[im 20/99  lung]
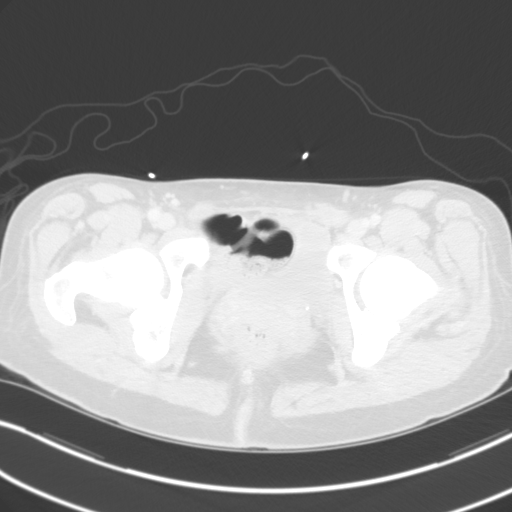
[im 20/99  bone]
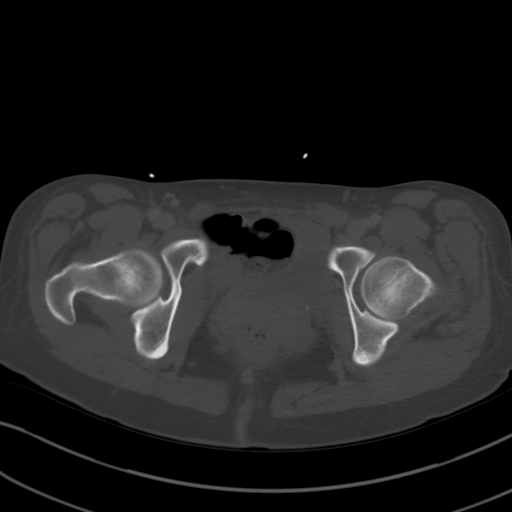
[im 40/99  soft-tissue]
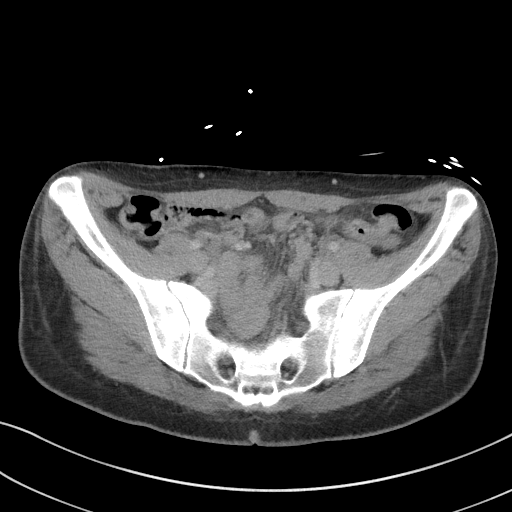
[im 40/99  lung]
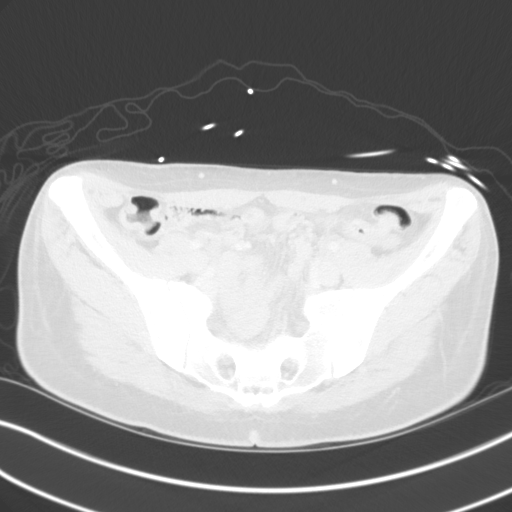
[im 59/99  soft-tissue]
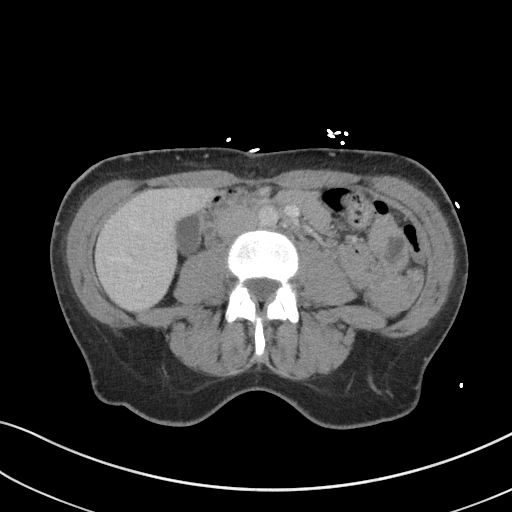
[im 59/99  lung]
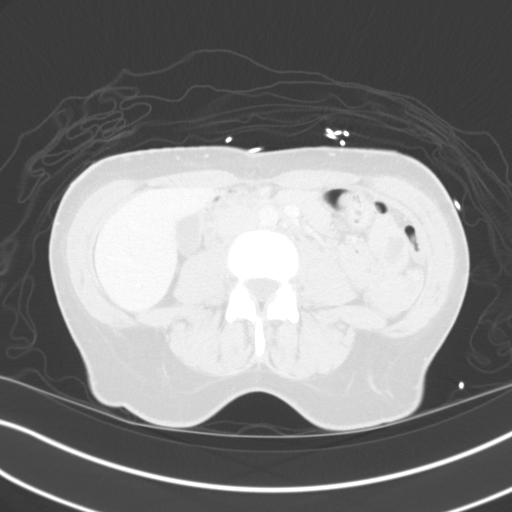
[im 79/99  soft-tissue]
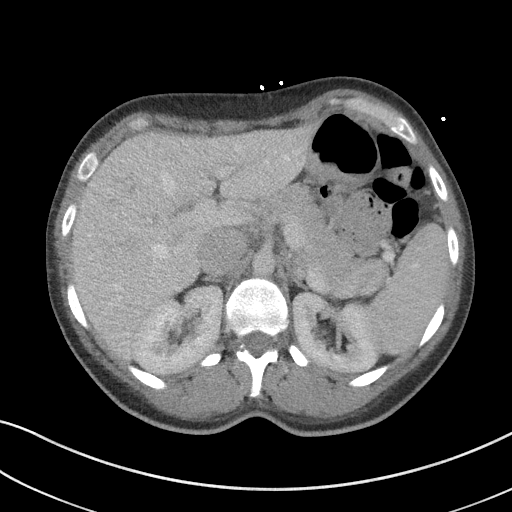
[im 79/99  lung]
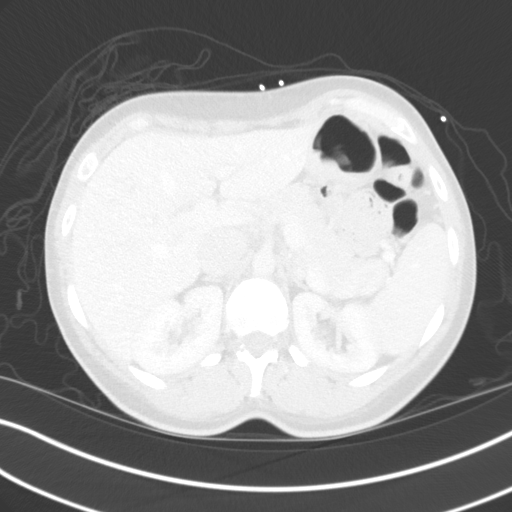

[11 of 46 positions shown; findings below may reference images not displayed]

FINDINGS: CTA CHEST FINDINGS

Cardiovascular: Heart size is normal. No significant pericardial
effusion is present. Aorta and great vessel origins are within
normal limits.

Pulmonary artery opacification is excellent. No focal filling
defects are present to suggest pulmonary emboli.

Mediastinum/Nodes: No significant mediastinal hilar, or axillary
adenopathy is present. Esophagus is within normal limits.

Lungs/Pleura: No focal nodule, mass, or airspace disease is present.
No significant pleural effusion.

Musculoskeletal: Chest wall is within normal limits. Vertebral body
heights and alignment are maintained. Ribs are unremarkable.

Review of the MIP images confirms the above findings.

CT ABDOMEN and PELVIS FINDINGS

Hepatobiliary: No focal liver abnormality is seen. No gallstones,
gallbladder wall thickening, or biliary dilatation.

Pancreas: Unremarkable. No pancreatic ductal dilatation or
surrounding inflammatory changes.

Spleen: Normal in size without focal abnormality.

Adrenals/Urinary Tract: Adrenal glands are normal bilaterally. A
simple cyst at the lower pole of the right kidney measures 10 mm. A
4 mm nonobstructing stone is present in the midportion of the left
kidney.

Stomach/Bowel: The stomach and duodenum are within normal limits.
Small bowel is unremarkable. Appendix is not discretely visualized.
No secondary inflammatory scratched at the appendix is visualized
and normal. The ascending and transverse colon are within normal
limits. Descending and sigmoid colon are normal.

Vascular/Lymphatic: No significant vascular findings are present. No
enlarged abdominal or pelvic lymph nodes.

Reproductive: Uterus is mildly edematous without focal lesion.
Dominant right follicle measures 2.5 cm. Left adnexa is
unremarkable.

Other: No abdominal wall hernia or abnormality. No abdominopelvic
ascites.

Musculoskeletal: Vertebral body heights are maintained. Pelvis is
within normal limits. Hips are located and normal.

Review of the MIP images confirms the above findings.
IMPRESSION: 1. No pulmonary embolus.
2. No acute or focal lesion to explain chest pain.
3. 4 mm nonobstructing stone in the left kidney.
4. Simple cyst at the lower pole of the right kidney.
5. Uterus is mildly edematous, likely physiologic.
6. No acute or focal lesion to explain pain otherwise.
